# Patient Record
Sex: Male | Born: 1950 | Race: White | Hispanic: No | Marital: Married | Smoking: Former smoker
Health system: Southern US, Community
[De-identification: ages and names within clinical notes are randomized; demographics above are authoritative.]

## PROBLEM LIST (undated history)

## (undated) DIAGNOSIS — I6529 Occlusion and stenosis of unspecified carotid artery: Secondary | ICD-10-CM

## (undated) DIAGNOSIS — I059 Rheumatic mitral valve disease, unspecified: Secondary | ICD-10-CM

## (undated) DIAGNOSIS — I519 Heart disease, unspecified: Secondary | ICD-10-CM

## (undated) DIAGNOSIS — I359 Nonrheumatic aortic valve disorder, unspecified: Secondary | ICD-10-CM

## (undated) DIAGNOSIS — I079 Rheumatic tricuspid valve disease, unspecified: Secondary | ICD-10-CM

## (undated) DIAGNOSIS — Z951 Presence of aortocoronary bypass graft: Secondary | ICD-10-CM

## (undated) DIAGNOSIS — E785 Hyperlipidemia, unspecified: Secondary | ICD-10-CM

## (undated) DIAGNOSIS — I77810 Thoracic aortic ectasia: Secondary | ICD-10-CM

## (undated) DIAGNOSIS — I10 Essential (primary) hypertension: Secondary | ICD-10-CM

## (undated) HISTORY — DX: Hyperlipidemia, unspecified: E78.5

## (undated) HISTORY — DX: Occlusion and stenosis of unspecified carotid artery: I65.29

## (undated) HISTORY — DX: Nonrheumatic aortic valve disorder, unspecified: I35.9

## (undated) HISTORY — PX: CARDIAC CATHETERIZATION: SHX172

## (undated) HISTORY — DX: Rheumatic mitral valve disease, unspecified: I05.9

## (undated) HISTORY — DX: Rheumatic tricuspid valve disease, unspecified: I07.9

## (undated) HISTORY — DX: Heart disease, unspecified: I51.9

## (undated) HISTORY — DX: Thoracic aortic ectasia: I77.810

---

## 2005-10-01 ENCOUNTER — Ambulatory Visit: Payer: Self-pay | Admitting: Sports Medicine

## 2007-04-23 HISTORY — PX: COLONOSCOPY: SHX174

## 2013-09-16 ENCOUNTER — Encounter (HOSPITAL_COMMUNITY): Payer: Self-pay | Admitting: Emergency Medicine

## 2013-09-16 ENCOUNTER — Emergency Department (HOSPITAL_COMMUNITY): Payer: BC Managed Care – PPO

## 2013-09-16 ENCOUNTER — Emergency Department (HOSPITAL_COMMUNITY)
Admission: EM | Admit: 2013-09-16 | Discharge: 2013-09-16 | Disposition: A | Discharge status: Home / Self Care | Payer: BC Managed Care – PPO | Attending: Emergency Medicine | Admitting: Emergency Medicine

## 2013-09-16 DIAGNOSIS — R55 Syncope and collapse: Secondary | ICD-10-CM | POA: Insufficient documentation

## 2013-09-16 DIAGNOSIS — Z87891 Personal history of nicotine dependence: Secondary | ICD-10-CM | POA: Insufficient documentation

## 2013-09-16 DIAGNOSIS — I10 Essential (primary) hypertension: Secondary | ICD-10-CM | POA: Insufficient documentation

## 2013-09-16 DIAGNOSIS — E860 Dehydration: Secondary | ICD-10-CM

## 2013-09-16 DIAGNOSIS — Z79899 Other long term (current) drug therapy: Secondary | ICD-10-CM | POA: Insufficient documentation

## 2013-09-16 DIAGNOSIS — R42 Dizziness and giddiness: Secondary | ICD-10-CM | POA: Insufficient documentation

## 2013-09-16 DIAGNOSIS — Z791 Long term (current) use of non-steroidal anti-inflammatories (NSAID): Secondary | ICD-10-CM | POA: Insufficient documentation

## 2013-09-16 DIAGNOSIS — R61 Generalized hyperhidrosis: Secondary | ICD-10-CM | POA: Insufficient documentation

## 2013-09-16 HISTORY — DX: Essential (primary) hypertension: I10

## 2013-09-16 LAB — URINALYSIS, ROUTINE W REFLEX MICROSCOPIC
Bilirubin Urine: NEGATIVE
Glucose, UA: NEGATIVE mg/dL
Hgb urine dipstick: NEGATIVE
Ketones, ur: NEGATIVE mg/dL
LEUKOCYTES UA: NEGATIVE
Nitrite: NEGATIVE
PROTEIN: NEGATIVE mg/dL
Specific Gravity, Urine: 1.027 (ref 1.005–1.030)
UROBILINOGEN UA: 0.2 mg/dL (ref 0.0–1.0)
pH: 5.5 (ref 5.0–8.0)

## 2013-09-16 LAB — CBC WITH DIFFERENTIAL/PLATELET
BASOS PCT: 0 % (ref 0–1)
Basophils Absolute: 0 10*3/uL (ref 0.0–0.1)
Eosinophils Absolute: 0.2 10*3/uL (ref 0.0–0.7)
Eosinophils Relative: 3 % (ref 0–5)
HCT: 38.3 % — ABNORMAL LOW (ref 39.0–52.0)
HEMOGLOBIN: 12.9 g/dL — AB (ref 13.0–17.0)
Lymphocytes Relative: 23 % (ref 12–46)
Lymphs Abs: 1.4 10*3/uL (ref 0.7–4.0)
MCH: 30.5 pg (ref 26.0–34.0)
MCHC: 33.7 g/dL (ref 30.0–36.0)
MCV: 90.5 fL (ref 78.0–100.0)
MONOS PCT: 7 % (ref 3–12)
Monocytes Absolute: 0.4 10*3/uL (ref 0.1–1.0)
NEUTROS PCT: 67 % (ref 43–77)
Neutro Abs: 4.1 10*3/uL (ref 1.7–7.7)
Platelets: 178 10*3/uL (ref 150–400)
RBC: 4.23 MIL/uL (ref 4.22–5.81)
RDW: 13.7 % (ref 11.5–15.5)
WBC: 6.2 10*3/uL (ref 4.0–10.5)

## 2013-09-16 LAB — COMPREHENSIVE METABOLIC PANEL
ALBUMIN: 3.7 g/dL (ref 3.5–5.2)
ALT: 35 U/L (ref 0–53)
AST: 39 U/L — ABNORMAL HIGH (ref 0–37)
Alkaline Phosphatase: 55 U/L (ref 39–117)
BUN: 24 mg/dL — AB (ref 6–23)
CO2: 25 mEq/L (ref 19–32)
CREATININE: 0.75 mg/dL (ref 0.50–1.35)
Calcium: 9.2 mg/dL (ref 8.4–10.5)
Chloride: 103 mEq/L (ref 96–112)
GFR calc Af Amer: >90 mL/min (ref >90)
GFR calc non Af Amer: >90 mL/min (ref >90)
Glucose, Bld: 92 mg/dL (ref 70–99)
POTASSIUM: 4 meq/L (ref 3.7–5.3)
Sodium: 140 mEq/L (ref 137–147)
Total Bilirubin: 0.4 mg/dL (ref 0.3–1.2)
Total Protein: 6.4 g/dL (ref 6.0–8.3)

## 2013-09-16 LAB — I-STAT TROPONIN, ED: TROPONIN I, POC: 0 ng/mL (ref 0.00–0.08)

## 2013-09-16 LAB — ETHANOL: Alcohol, Ethyl (B): <11 mg/dL (ref 0–11)

## 2013-09-16 MED ORDER — SODIUM CHLORIDE 0.9 % IV BOLUS (SEPSIS)
1000.0000 mL | Freq: Once | INTRAVENOUS | Status: AC
  Administered 2013-09-16: 1000 mL via INTRAVENOUS

## 2013-09-16 NOTE — Discharge Instructions (Signed)
Dehydration, Adult Dehydration means your body does not have as much fluid as it needs. Your kidneys, brain, and heart will not work properly without the right amount of fluids and salt.  HOME CARE  Ask your doctor how to replace body fluid losses (rehydrate).  Drink enough fluids to keep your pee (urine) clear or pale yellow.  Drink small amounts of fluids often if you feel sick to your stomach (nauseous) or throw up (vomit).  Eat like you normally do.  Avoid:  Foods or drinks high in sugar.  Bubbly (carbonated) drinks.  Juice.  Very hot or cold fluids.  Drinks with caffeine.  Fatty, greasy foods.  Alcohol.  Tobacco.  Eating too much.  Gelatin desserts.  Wash your hands to avoid spreading germs (bacteria, viruses).  Only take medicine as told by your doctor.  Keep all doctor visits as told. GET HELP RIGHT AWAY IF:   You cannot drink something without throwing up.  You get worse even with treatment.  Your vomit has blood in it or looks greenish.  Your poop (stool) has blood in it or looks black and tarry.  You have not peed in 6 to 8 hours.  You pee a small amount of very dark pee.  You have a fever.  You pass out (faint).  You have belly (abdominal) pain that gets worse or stays in one spot (localizes).  You have a rash, stiff neck, or bad headache.  You get easily annoyed, sleepy, or are hard to wake up.  You feel weak, dizzy, or very thirsty. MAKE SURE YOU:   Understand these instructions.  Will watch your condition.  Will get help right away if you are not doing well or get worse. Document Released: 02/02/2009 Document Revised: 07/01/2011 Document Reviewed: 11/26/2010 [facility] Patient Information 2014 [place], [place]. Syncope Syncope is a fainting spell. This means the person loses consciousness and drops to the ground. The person is generally unconscious for less than 5 minutes. The person may have some muscle twitches for up to 15  seconds before waking up and returning to normal. Syncope occurs more often in elderly people, but it can happen to anyone. While most causes of syncope are not dangerous, syncope can be a sign of a serious medical problem. It is important to seek medical care.  CAUSES  Syncope is caused by a sudden decrease in blood flow to the brain. The specific cause is often not determined. Factors that can trigger syncope include:  Taking medicines that lower blood pressure.  Sudden changes in posture, such as standing up suddenly.  Taking more medicine than prescribed.  Standing in one place for too long.  Seizure disorders.  Dehydration and excessive exposure to heat.  Low blood sugar (hypoglycemia).  Straining to have a bowel movement.  Heart disease, irregular heartbeat, or other circulatory problems.  Fear, emotional distress, seeing blood, or severe pain. SYMPTOMS  Right before fainting, you may:  Feel dizzy or lightheaded.  Feel nauseous.  See all white or all black in your field of vision.  Have cold, clammy skin. DIAGNOSIS  Your caregiver will ask about your symptoms, perform a physical exam, and perform electrocardiography (ECG) to record the electrical activity of your heart. Your caregiver may also perform other heart or blood tests to determine the cause of your syncope. TREATMENT  In most cases, no treatment is needed. Depending on the cause of your syncope, your caregiver may recommend changing or stopping some of your medicines. HOME CARE  INSTRUCTIONS  Have someone stay with you until you feel stable.  Do not drive, operate machinery, or play sports until your caregiver says it is okay.  Keep all follow-up appointments as directed by your caregiver.  Lie down right away if you start feeling like you might faint. Breathe deeply and steadily. Wait until all the symptoms have passed.  Drink enough fluids to keep your urine clear or pale yellow.  If you are taking  blood pressure or heart medicine, get up slowly, taking several minutes to sit and then stand. This can reduce dizziness. SEEK IMMEDIATE MEDICAL CARE IF:   You have a severe headache.  You have unusual pain in the chest, abdomen, or back.  You are bleeding from the mouth or rectum, or you have black or tarry stool.  You have an irregular or very fast heartbeat.  You have pain with breathing.  You have repeated fainting or seizure-like jerking during an episode.  You faint when sitting or lying down.  You have confusion.  You have difficulty walking.  You have severe weakness.  You have vision problems. If you fainted, call your local emergency services (911 in [place] Do not drive yourself to the hospital.  MAKE SURE YOU:  Understand these instructions.  Will watch your condition.  Will get help right away if you are not doing well or get worse. Document Released: 04/08/2005 Document Revised: 10/08/2011 Document Reviewed: 06/07/2011 [facility] Patient Information 2014 [place].

## 2013-09-16 NOTE — ED Notes (Signed)
Per EMS, pt passed out at home. Has been working outside for several days and has had four 12oz beers today, per pt. EMS reports pt was refusing transport, they stood him up and he passed out again, vitals were Pulse 52, BP 90/48, CBG 89.

## 2013-09-16 NOTE — ED Provider Notes (Signed)
CSN: [id number]     Arrival date & time 09/16/13  1729 History   First MD Initiated Contact with Patient 09/16/13 1730     Chief Complaint  Patient presents with  . Loss of Consciousness   HPI Comments: Patient is a 63 y.o. Male who presents to the [facility] ED via EMS with 2 episodes of syncope.  First episode of syncope was witnessed by his wife who stated that the patient was unconscious between 3-5 minutes.  His wife states that during the first unconscious spell the patient became sweaty and non-responsive and then began to snore slumped over.  When EMS reported the patient stood up and had a vagal episode with HR decline to the high 40's and a decrease in systolic blood pressure to the 80's.  Vitals improved upon laying down.  Patient states that he has been working out in the sun for the past three days and has not been drinking water.  Instead he has been having 4-6 light beers per day.  Patient states that he has a history of hypertension and depression which are both stable on treatment.  He denies any fevers, headaches, nausea, vomiting, visual changes, weakness, tremors, palpitations, chest pain, shortness of breath, or changes in bowel or bladder habits.      The history is provided by the patient and the EMS personnel. No language interpreter was used.    Past Medical History  Diagnosis Date  . Hypertension    History reviewed. No pertinent past surgical history. History reviewed. No pertinent family history. History  Substance Use Topics  . Smoking status: Former Research scientist (life sciences)  . Smokeless tobacco: Not on file  . Alcohol Use: Yes    Review of Systems  Constitutional: Positive for diaphoresis. Negative for fever, chills, activity change and fatigue.  Respiratory: Negative for cough, chest tightness and shortness of breath.   Gastrointestinal: Negative for nausea, vomiting, abdominal pain, diarrhea and constipation.  Genitourinary: Negative for dysuria, frequency, hematuria and difficulty  urinating.  Neurological: Positive for syncope and light-headedness. Negative for dizziness, tremors, speech difficulty, weakness, numbness and headaches.  All other systems reviewed and are negative.     Allergies  Review of patient's allergies indicates no known allergies.  Home Medications   Prior to Admission medications   Medication Sig Start Date End Date Taking? Authorizing Provider  atorvastatin (LIPITOR) 10 MG tablet Take 10 mg by mouth daily. 08/25/13  Yes Historical Provider, MD  buPROPion (WELLBUTRIN SR) 100 MG 12 hr tablet Take 100 mg by mouth daily. 08/25/13  Yes Historical Provider, MD  lisinopril (PRINIVIL,ZESTRIL) 20 MG tablet Take 20 mg by mouth daily. 08/25/13  Yes Historical Provider, MD  naproxen sodium (ANAPROX) 220 MG tablet Take 220 mg by mouth daily as needed (pain).   Yes Historical Provider, MD  VIAGRA 100 MG tablet Take 100 mg by mouth daily as needed. Erectile Dysfunction 06/10/13  Yes Historical Provider, MD  vitamin C (ASCORBIC ACID) 500 MG tablet Take 500 mg by mouth daily.   Yes Historical Provider, MD   BP 137/79  Pulse 61  Temp(Src) 98.1 F (36.7 C) (Oral)  Resp 15  SpO2 98% Physical Exam  Nursing note and vitals reviewed. Constitutional: He is oriented to person, place, and time. He appears well-developed and well-nourished. No distress.  HENT:  Head: Normocephalic and atraumatic.  Nose: Nose normal.  Mouth/Throat: Oropharynx is clear and moist. No oropharyngeal exudate.  Eyes: Conjunctivae and EOM are normal. Pupils are equal, round, and reactive  to light. Right eye exhibits no discharge. Left eye exhibits no discharge. No scleral icterus.  Neck: Normal range of motion. Neck supple.  Cardiovascular: Normal rate, regular rhythm, normal heart sounds and intact distal pulses.  Exam reveals no gallop and no friction rub.   No murmur heard. Pulmonary/Chest: Effort normal and breath sounds normal. No respiratory distress. He has no wheezes. He has no  rales. He exhibits no tenderness.  Abdominal: Soft. Bowel sounds are normal. He exhibits no distension and no mass. There is no tenderness. There is no rebound and no guarding.  Musculoskeletal: Normal range of motion.  Lymphadenopathy:    He has no cervical adenopathy.  Neurological: He is alert and oriented to person, place, and time. No cranial nerve deficit. He exhibits normal muscle tone. Coordination normal.  Skin: Skin is warm and dry. He is not diaphoretic.  Psychiatric: He has a normal mood and affect. His behavior is normal. Judgment and thought content normal.    ED Course  Procedures (including critical care time) Labs Review Labs Reviewed  CBC WITH DIFFERENTIAL - Abnormal; Notable for the following:    Hemoglobin 12.9 (*)    HCT 38.3 (*)    All other components within normal limits  COMPREHENSIVE METABOLIC PANEL - Abnormal; Notable for the following:    BUN 24 (*)    AST 39 (*)    All other components within normal limits  ETHANOL  URINALYSIS, ROUTINE W REFLEX MICROSCOPIC  I-STAT TROPOININ, ED    Imaging Review Dg Chest 2 View  09/16/2013   CLINICAL DATA:  Syncope  EXAM: CHEST  2 VIEW  COMPARISON:  None.  FINDINGS: Cardiomediastinal silhouette is unremarkable. No acute infiltrate or pleural effusion. No pulmonary edema. Mild degenerative changes thoracic spine.  IMPRESSION: No active cardiopulmonary disease.   Electronically Signed   By: Lahoma Crocker M.D.   On: 09/16/2013 20:19     EKG Interpretation   Date/Time:  Thursday Sep 16 2013 17:51:47 EDT Ventricular Rate:  65 PR Interval:  188 QRS Duration: 91 QT Interval:  411 QTC Calculation: 427 R Axis:   55 Text Interpretation:  Sinus rhythm No old tracing to compare Confirmed by  BELFI  MD, MELANIE ([id number]) on 09/16/2013 8:13:04 PM      MDM   Final diagnoses:  Dehydration  Syncope   Patient presents to the [facility] ED with witnessed syncopal episode.  Have performed cbc, cmp, alcohol, cxr, ekg, troponin, and UA.   We have given a 1,000 mL NS bolus.  Cbc, CMP, alcohol, troponin, Ekg, alcohol, and CXR have come back unremarkable for abnormalities.  BUN to creatinine ratio shows ratio of 32:1.  Patient also has an increased urine specific gravity of 1.027 which supports dehydration.  Suspect that exertional dehydration likely cause of syncope.  Patient has been able to ambulate here without orthostatic symptoms. Will discharge the patient home at this time and have recommended close follow-up with his primary care provider.       Kenard Gower, PA-C 09/16/13 2024

## 2013-09-16 NOTE — ED Notes (Signed)
Pt returned from CT °

## 2013-09-16 NOTE — ED Notes (Signed)
Pt ambulated approximately 100 ft without difficulty and without assistance. Pt denied dizziness or weakness when ambulating.

## 2013-09-16 NOTE — ED Notes (Signed)
Patient transported to X-ray 

## 2013-09-21 NOTE — ED Provider Notes (Signed)
Medical screening examination/treatment/procedure(s) were performed by non-physician practitioner and as supervising physician I was immediately available for consultation/collaboration.   EKG Interpretation   Date/Time:  Thursday Sep 16 2013 17:51:47 EDT Ventricular Rate:  65 PR Interval:  188 QRS Duration: 91 QT Interval:  411 QTC Calculation: 427 R Axis:   55 Text Interpretation:  Sinus rhythm No old tracing to compare Confirmed by  Cheryel Kyte  MD, Ashmi Blas ([id number]) on 09/16/2013 8:13:04 PM        Malvin Johns, MD 09/21/13 2337

## 2014-03-03 ENCOUNTER — Inpatient Hospital Stay (HOSPITAL_COMMUNITY)
Admission: EM | Admit: 2014-03-03 | Discharge: 2014-03-08 | DRG: 234 | Disposition: A | Discharge status: Home / Self Care | Payer: BC Managed Care – PPO | Attending: Thoracic Surgery (Cardiothoracic Vascular Surgery) | Admitting: Thoracic Surgery (Cardiothoracic Vascular Surgery)

## 2014-03-03 ENCOUNTER — Encounter: Payer: Self-pay | Admitting: Cardiology

## 2014-03-03 ENCOUNTER — Ambulatory Visit (INDEPENDENT_AMBULATORY_CARE_PROVIDER_SITE_OTHER): Payer: BC Managed Care – PPO | Admitting: Cardiology

## 2014-03-03 ENCOUNTER — Other Ambulatory Visit: Payer: Self-pay | Admitting: *Deleted

## 2014-03-03 ENCOUNTER — Encounter (HOSPITAL_COMMUNITY)
Admission: EM | Disposition: A | Discharge status: Home / Self Care | Payer: Self-pay | Source: Home / Self Care | Attending: Thoracic Surgery (Cardiothoracic Vascular Surgery)

## 2014-03-03 ENCOUNTER — Encounter (HOSPITAL_COMMUNITY): Payer: Self-pay | Admitting: Emergency Medicine

## 2014-03-03 VITALS — BP 124/90 | HR 65 | Ht 68.0 in | Wt 147.4 lb

## 2014-03-03 DIAGNOSIS — J9811 Atelectasis: Secondary | ICD-10-CM | POA: Diagnosis not present

## 2014-03-03 DIAGNOSIS — Z8249 Family history of ischemic heart disease and other diseases of the circulatory system: Secondary | ICD-10-CM

## 2014-03-03 DIAGNOSIS — I200 Unstable angina: Secondary | ICD-10-CM | POA: Diagnosis not present

## 2014-03-03 DIAGNOSIS — Z01810 Encounter for preprocedural cardiovascular examination: Secondary | ICD-10-CM

## 2014-03-03 DIAGNOSIS — R229 Localized swelling, mass and lump, unspecified: Secondary | ICD-10-CM

## 2014-03-03 DIAGNOSIS — R079 Chest pain, unspecified: Secondary | ICD-10-CM

## 2014-03-03 DIAGNOSIS — I25110 Atherosclerotic heart disease of native coronary artery with unstable angina pectoris: Secondary | ICD-10-CM | POA: Diagnosis not present

## 2014-03-03 DIAGNOSIS — I25119 Atherosclerotic heart disease of native coronary artery with unspecified angina pectoris: Secondary | ICD-10-CM

## 2014-03-03 DIAGNOSIS — Z87891 Personal history of nicotine dependence: Secondary | ICD-10-CM | POA: Diagnosis not present

## 2014-03-03 DIAGNOSIS — I10 Essential (primary) hypertension: Secondary | ICD-10-CM | POA: Diagnosis present

## 2014-03-03 DIAGNOSIS — D696 Thrombocytopenia, unspecified: Secondary | ICD-10-CM | POA: Diagnosis not present

## 2014-03-03 DIAGNOSIS — E8770 Fluid overload, unspecified: Secondary | ICD-10-CM | POA: Diagnosis not present

## 2014-03-03 DIAGNOSIS — D62 Acute posthemorrhagic anemia: Secondary | ICD-10-CM | POA: Diagnosis not present

## 2014-03-03 DIAGNOSIS — IMO0002 Reserved for concepts with insufficient information to code with codable children: Secondary | ICD-10-CM

## 2014-03-03 DIAGNOSIS — E785 Hyperlipidemia, unspecified: Secondary | ICD-10-CM

## 2014-03-03 DIAGNOSIS — J90 Pleural effusion, not elsewhere classified: Secondary | ICD-10-CM | POA: Diagnosis not present

## 2014-03-03 DIAGNOSIS — Z7982 Long term (current) use of aspirin: Secondary | ICD-10-CM

## 2014-03-03 DIAGNOSIS — Z951 Presence of aortocoronary bypass graft: Secondary | ICD-10-CM

## 2014-03-03 DIAGNOSIS — I77810 Thoracic aortic ectasia: Secondary | ICD-10-CM | POA: Diagnosis present

## 2014-03-03 HISTORY — PX: LEFT HEART CATHETERIZATION WITH CORONARY ANGIOGRAM: SHX5451

## 2014-03-03 HISTORY — DX: Presence of aortocoronary bypass graft: Z95.1

## 2014-03-03 LAB — TYPE AND SCREEN
ABO/RH(D): A POS
ANTIBODY SCREEN: NEGATIVE

## 2014-03-03 LAB — CBC WITH DIFFERENTIAL/PLATELET
BASOS ABS: 0 10*3/uL (ref 0.0–0.1)
Basophils Relative: 0 % (ref 0–1)
EOS PCT: 3 % (ref 0–5)
Eosinophils Absolute: 0.2 10*3/uL (ref 0.0–0.7)
HEMATOCRIT: 45.3 % (ref 39.0–52.0)
HEMOGLOBIN: 15.1 g/dL (ref 13.0–17.0)
LYMPHS PCT: 28 % (ref 12–46)
Lymphs Abs: 1.7 10*3/uL (ref 0.7–4.0)
MCH: 30.6 pg (ref 26.0–34.0)
MCHC: 33.3 g/dL (ref 30.0–36.0)
MCV: 91.9 fL (ref 78.0–100.0)
MONO ABS: 0.3 10*3/uL (ref 0.1–1.0)
Monocytes Relative: 6 % (ref 3–12)
NEUTROS ABS: 3.7 10*3/uL (ref 1.7–7.7)
Neutrophils Relative %: 63 % (ref 43–77)
Platelets: 242 10*3/uL (ref 150–400)
RBC: 4.93 MIL/uL (ref 4.22–5.81)
RDW: 13 % (ref 11.5–15.5)
WBC: 5.8 10*3/uL (ref 4.0–10.5)

## 2014-03-03 LAB — ABO/RH: ABO/RH(D): A POS

## 2014-03-03 LAB — URINALYSIS, ROUTINE W REFLEX MICROSCOPIC
BILIRUBIN URINE: NEGATIVE
Glucose, UA: NEGATIVE mg/dL
HGB URINE DIPSTICK: NEGATIVE
Ketones, ur: NEGATIVE mg/dL
Leukocytes, UA: NEGATIVE
Nitrite: NEGATIVE
PH: 7.5 (ref 5.0–8.0)
Protein, ur: NEGATIVE mg/dL
SPECIFIC GRAVITY, URINE: 1.021 (ref 1.005–1.030)
UROBILINOGEN UA: 0.2 mg/dL (ref 0.0–1.0)

## 2014-03-03 LAB — HEMOGLOBIN A1C
HEMOGLOBIN A1C: 5.5 % (ref <5.7)
Mean Plasma Glucose: 111 mg/dL (ref <117)

## 2014-03-03 LAB — MRSA PCR SCREENING: MRSA by PCR: NEGATIVE

## 2014-03-03 LAB — MAGNESIUM: Magnesium: 2.2 mg/dL (ref 1.5–2.5)

## 2014-03-03 LAB — APTT: aPTT: 28 seconds (ref 24–37)

## 2014-03-03 LAB — COMPREHENSIVE METABOLIC PANEL
ALT: 34 U/L (ref 0–53)
AST: 32 U/L (ref 0–37)
Albumin: 4.1 g/dL (ref 3.5–5.2)
Alkaline Phosphatase: 67 U/L (ref 39–117)
Anion gap: 13 (ref 5–15)
BUN: 13 mg/dL (ref 6–23)
CALCIUM: 9.7 mg/dL (ref 8.4–10.5)
CHLORIDE: 99 meq/L (ref 96–112)
CO2: 25 meq/L (ref 19–32)
CREATININE: 0.67 mg/dL (ref 0.50–1.35)
GFR calc Af Amer: >90 mL/min (ref >90)
Glucose, Bld: 85 mg/dL (ref 70–99)
Potassium: 4.1 mEq/L (ref 3.7–5.3)
Sodium: 137 mEq/L (ref 137–147)
Total Bilirubin: 0.4 mg/dL (ref 0.3–1.2)
Total Protein: 7.4 g/dL (ref 6.0–8.3)

## 2014-03-03 LAB — TSH: TSH: 1.86 u[IU]/mL (ref 0.350–4.500)

## 2014-03-03 LAB — SURGICAL PCR SCREEN
MRSA, PCR: NEGATIVE
Staphylococcus aureus: NEGATIVE

## 2014-03-03 LAB — TROPONIN I
Troponin I: <0.3 ng/mL (ref <0.30)
Troponin I: <0.3 ng/mL (ref <0.30)

## 2014-03-03 LAB — PROTIME-INR
INR: 0.95 (ref 0.00–1.49)
Prothrombin Time: 12.8 seconds (ref 11.6–15.2)

## 2014-03-03 SURGERY — LEFT HEART CATHETERIZATION WITH CORONARY ANGIOGRAM
Anesthesia: LOCAL

## 2014-03-03 MED ORDER — METOPROLOL TARTRATE 12.5 MG HALF TABLET
12.5000 mg | ORAL_TABLET | Freq: Once | ORAL | Status: AC
  Administered 2014-03-04: 12.5 mg via ORAL
  Filled 2014-03-03: qty 1

## 2014-03-03 MED ORDER — METOPROLOL TARTRATE 12.5 MG HALF TABLET
12.5000 mg | ORAL_TABLET | Freq: Two times a day (BID) | ORAL | Status: DC
  Administered 2014-03-03: 12.5 mg via ORAL
  Filled 2014-03-03 (×2): qty 1

## 2014-03-03 MED ORDER — MAGNESIUM SULFATE 50 % IJ SOLN
40.0000 meq | INTRAMUSCULAR | Status: DC
  Filled 2014-03-03: qty 10

## 2014-03-03 MED ORDER — SODIUM CHLORIDE 0.9 % IV SOLN
INTRAVENOUS | Status: AC
  Administered 2014-03-04: 1.2 [IU]/h via INTRAVENOUS
  Filled 2014-03-03: qty 2.5

## 2014-03-03 MED ORDER — SODIUM CHLORIDE 0.9 % IV SOLN: 250.0000 mL | INTRAVENOUS | Status: DC | PRN

## 2014-03-03 MED ORDER — DEXTROSE 5 % IV SOLN
750.0000 mg | INTRAVENOUS | Status: DC
  Filled 2014-03-03: qty 750

## 2014-03-03 MED ORDER — SODIUM CHLORIDE 0.9 % IJ SOLN
3.0000 mL | Freq: Two times a day (BID) | INTRAMUSCULAR | Status: DC
  Administered 2014-03-04: 3 mL via INTRAVENOUS

## 2014-03-03 MED ORDER — FENTANYL CITRATE 0.05 MG/ML IJ SOLN
INTRAMUSCULAR | Status: AC
  Filled 2014-03-03: qty 2

## 2014-03-03 MED ORDER — ATORVASTATIN CALCIUM 40 MG PO TABS
40.0000 mg | ORAL_TABLET | Freq: Every day | ORAL | Status: DC
  Filled 2014-03-03 (×2): qty 1

## 2014-03-03 MED ORDER — SODIUM CHLORIDE 0.9 % IV SOLN
1.0000 mL/kg/h | INTRAVENOUS | Status: DC
  Administered 2014-03-03: 1 mL/kg/h via INTRAVENOUS

## 2014-03-03 MED ORDER — VERAPAMIL HCL 2.5 MG/ML IV SOLN
INTRAVENOUS | Status: AC
  Filled 2014-03-03: qty 2

## 2014-03-03 MED ORDER — ASPIRIN 81 MG PO CHEW
324.0000 mg | CHEWABLE_TABLET | Freq: Once | ORAL | Status: AC
  Administered 2014-03-03: 324 mg via ORAL

## 2014-03-03 MED ORDER — HEPARIN (PORCINE) IN NACL 100-0.45 UNIT/ML-% IJ SOLN
800.0000 [IU]/h | INTRAMUSCULAR | Status: DC
  Administered 2014-03-03: 800 [IU]/h via INTRAVENOUS
  Filled 2014-03-03: qty 250

## 2014-03-03 MED ORDER — MIDAZOLAM HCL 2 MG/2ML IJ SOLN
INTRAMUSCULAR | Status: AC
  Filled 2014-03-03: qty 2

## 2014-03-03 MED ORDER — NITROGLYCERIN 0.4 MG SL SUBL
0.4000 mg | SUBLINGUAL_TABLET | SUBLINGUAL | Status: DC | PRN
  Administered 2014-03-03: 0.4 mg via SUBLINGUAL

## 2014-03-03 MED ORDER — SODIUM CHLORIDE 0.9 % IJ SOLN: 3.0000 mL | INTRAMUSCULAR | Status: DC | PRN

## 2014-03-03 MED ORDER — SODIUM CHLORIDE 0.9 % IV SOLN
250.0000 mL | INTRAVENOUS | Status: DC | PRN
  Administered 2014-03-03: 500 mL via INTRAVENOUS

## 2014-03-03 MED ORDER — PHENYLEPHRINE HCL 10 MG/ML IJ SOLN
30.0000 ug/min | INTRAVENOUS | Status: AC
  Administered 2014-03-04: 10 ug/min via INTRAVENOUS
  Filled 2014-03-03: qty 2

## 2014-03-03 MED ORDER — PLASMA-LYTE 148 IV SOLN
INTRAVENOUS | Status: AC
  Administered 2014-03-04: 500 mL
  Filled 2014-03-03: qty 2.5

## 2014-03-03 MED ORDER — DEXMEDETOMIDINE HCL IN NACL 400 MCG/100ML IV SOLN
0.1000 ug/kg/h | INTRAVENOUS | Status: AC
  Administered 2014-03-04: 0.3 ug/kg/h via INTRAVENOUS
  Filled 2014-03-03: qty 100

## 2014-03-03 MED ORDER — POTASSIUM CHLORIDE 2 MEQ/ML IV SOLN
80.0000 meq | INTRAVENOUS | Status: DC
  Filled 2014-03-03: qty 40

## 2014-03-03 MED ORDER — ASPIRIN 81 MG PO CHEW: 81.0000 mg | CHEWABLE_TABLET | ORAL | Status: DC

## 2014-03-03 MED ORDER — SODIUM CHLORIDE 0.9 % IV SOLN: INTRAVENOUS | Status: DC

## 2014-03-03 MED ORDER — NITROGLYCERIN 1 MG/10 ML FOR IR/CATH LAB
INTRA_ARTERIAL | Status: AC
  Filled 2014-03-03: qty 10

## 2014-03-03 MED ORDER — HEPARIN BOLUS VIA INFUSION
4000.0000 [IU] | Freq: Once | INTRAVENOUS | Status: AC
  Administered 2014-03-03: 4000 [IU] via INTRAVENOUS
  Filled 2014-03-03: qty 4000

## 2014-03-03 MED ORDER — SODIUM CHLORIDE 0.9 % IV SOLN
INTRAVENOUS | Status: AC
  Administered 2014-03-04: 69.8 mL/h via INTRAVENOUS
  Filled 2014-03-03: qty 40

## 2014-03-03 MED ORDER — DEXTROSE 5 % IV SOLN
1.5000 g | INTRAVENOUS | Status: AC
  Administered 2014-03-04: 1.5 g via INTRAVENOUS
  Administered 2014-03-04: .75 g via INTRAVENOUS
  Filled 2014-03-03: qty 1.5

## 2014-03-03 MED ORDER — LIDOCAINE HCL (PF) 1 % IJ SOLN
INTRAMUSCULAR | Status: AC
  Filled 2014-03-03: qty 30

## 2014-03-03 MED ORDER — NITROGLYCERIN IN D5W 200-5 MCG/ML-% IV SOLN
3.0000 ug/min | INTRAVENOUS | Status: DC
  Administered 2014-03-03: 5 ug/min via INTRAVENOUS
  Filled 2014-03-03: qty 250

## 2014-03-03 MED ORDER — NITROGLYCERIN 0.4 MG SL SUBL: 0.4000 mg | SUBLINGUAL_TABLET | SUBLINGUAL | Status: DC | PRN

## 2014-03-03 MED ORDER — VANCOMYCIN HCL 10 G IV SOLR
1250.0000 mg | INTRAVENOUS | Status: AC
  Administered 2014-03-04: 1250 mg via INTRAVENOUS
  Filled 2014-03-03: qty 1250

## 2014-03-03 MED ORDER — DOPAMINE-DEXTROSE 3.2-5 MG/ML-% IV SOLN
0.0000 ug/kg/min | INTRAVENOUS | Status: DC
  Filled 2014-03-03: qty 250

## 2014-03-03 MED ORDER — HEPARIN (PORCINE) IN NACL 2-0.9 UNIT/ML-% IJ SOLN
INTRAMUSCULAR | Status: AC
Start: 2014-03-03 — End: 2014-03-03
  Filled 2014-03-03: qty 1000

## 2014-03-03 MED ORDER — SODIUM CHLORIDE 0.9 % IV SOLN
INTRAVENOUS | Status: DC
  Filled 2014-03-03: qty 30

## 2014-03-03 MED ORDER — HEPARIN (PORCINE) IN NACL 100-0.45 UNIT/ML-% IJ SOLN
800.0000 [IU]/h | INTRAMUSCULAR | Status: DC
  Administered 2014-03-03: 800 [IU]/h via INTRAVENOUS

## 2014-03-03 MED ORDER — EPINEPHRINE HCL 1 MG/ML IJ SOLN
0.0000 ug/min | INTRAVENOUS | Status: DC
  Filled 2014-03-03: qty 4

## 2014-03-03 MED ORDER — BISACODYL 5 MG PO TBEC: 5.0000 mg | DELAYED_RELEASE_TABLET | Freq: Once | ORAL | Status: DC

## 2014-03-03 MED ORDER — ASPIRIN 300 MG RE SUPP: 300.0000 mg | RECTAL | Status: DC

## 2014-03-03 MED ORDER — ONDANSETRON HCL 4 MG/2ML IJ SOLN: 4.0000 mg | Freq: Four times a day (QID) | INTRAMUSCULAR | Status: DC | PRN

## 2014-03-03 MED ORDER — SODIUM CHLORIDE 0.9 % IJ SOLN
3.0000 mL | INTRAMUSCULAR | Status: DC | PRN
Start: 2014-03-03 — End: 2014-03-04

## 2014-03-03 MED ORDER — SODIUM CHLORIDE 0.9 % IJ SOLN
3.0000 mL | Freq: Two times a day (BID) | INTRAMUSCULAR | Status: DC
  Administered 2014-03-03: 3 mL via INTRAVENOUS

## 2014-03-03 MED ORDER — VANCOMYCIN HCL 1000 MG IV SOLR
INTRAVENOUS | Status: AC
  Administered 2014-03-04: 1000 mL
  Filled 2014-03-03: qty 1000

## 2014-03-03 MED ORDER — ASPIRIN EC 81 MG PO TBEC: 324.0000 mg | DELAYED_RELEASE_TABLET | Freq: Once | ORAL | Status: DC

## 2014-03-03 MED ORDER — ~~LOC~~ CARDIAC SURGERY, PATIENT & FAMILY EDUCATION
Freq: Once | Status: AC
  Administered 2014-03-03: 19:00:00
  Filled 2014-03-03: qty 1

## 2014-03-03 MED ORDER — ASPIRIN 81 MG PO CHEW: 324.0000 mg | CHEWABLE_TABLET | ORAL | Status: DC

## 2014-03-03 MED ORDER — TEMAZEPAM 15 MG PO CAPS: 15.0000 mg | ORAL_CAPSULE | Freq: Once | ORAL | Status: AC | PRN

## 2014-03-03 MED ORDER — METOPROLOL TARTRATE 12.5 MG HALF TABLET
12.5000 mg | ORAL_TABLET | Freq: Two times a day (BID) | ORAL | Status: DC
  Administered 2014-03-04: 12.5 mg via ORAL
  Filled 2014-03-03 (×2): qty 1

## 2014-03-03 MED ORDER — NITROGLYCERIN IN D5W 200-5 MCG/ML-% IV SOLN
2.0000 ug/min | INTRAVENOUS | Status: DC
Start: 2014-03-04 — End: 2014-03-04
  Filled 2014-03-03: qty 250

## 2014-03-03 MED ORDER — ACETAMINOPHEN 325 MG PO TABS: 650.0000 mg | ORAL_TABLET | ORAL | Status: DC | PRN

## 2014-03-03 MED ORDER — ASPIRIN EC 81 MG PO TBEC
81.0000 mg | DELAYED_RELEASE_TABLET | Freq: Every day | ORAL | Status: DC
  Administered 2014-03-04: 81 mg via ORAL
  Filled 2014-03-03: qty 1

## 2014-03-03 MED ORDER — HEPARIN SODIUM (PORCINE) 1000 UNIT/ML IJ SOLN
INTRAMUSCULAR | Status: AC
  Filled 2014-03-03: qty 1

## 2014-03-03 NOTE — Progress Notes (Signed)
ANTICOAGULATION CONSULT NOTE - Initial Consult  Pharmacy Consult for Heparin Indication: chest pain/ACS  No Known Allergies  Patient Measurements: Height: 5\' 8"  (172.7 cm) Weight: 147 lb (66.679 kg) IBW/kg (Calculated) : 68.4  Vital Signs: Temp: 98.4 F (36.9 C) (11/12 1057) Temp Source: Oral (11/12 1057) BP: 151/88 mmHg (11/12 1057) Pulse Rate: 65 (11/12 0949)  Labs: No results for input(s): HGB, HCT, PLT, APTT, LABPROT, INR, HEPARINUNFRC, CREATININE, CKTOTAL, CKMB, TROPONINI in the last 72 hours.  Estimated Creatinine Clearance: 86.8 mL/min (by C-G formula based on Cr of 0.75).   Medical History: Past Medical History  Diagnosis Date  . Hyperlipidemia   . Hypertension     Medications:  See electronic med rec   Assessment: 63 y.o. male presents with crescendo angina. EKG shows new T-wave inversions. Admitted directly from o/p office. To begin heparin gtt for r/o ACS. Plan for cath today. Baseline labs pending.  Goal of Therapy:  Heparin level 0.3-0.7 units/ml Monitor platelets by anticoagulation protocol: Yes   Plan:  1. Heparin IV bolus 4000 units 2. Heparin gtt at 800 units/hr 3. Will f/u post cath  Sherlon Handing, PharmD, BCPS Clinical pharmacist, pager [phone] 03/03/2014,10:59 AM

## 2014-03-03 NOTE — Progress Notes (Signed)
ANTICOAGULATION CONSULT NOTE - Follow Up Consult  Pharmacy Consult:  Heparin Indication:  S/p cath, awaiting CABG decision  No Known Allergies  Patient Measurements: Height: 5\' 8"  (172.7 cm) Weight: 143 lb 11.8 oz (65.2 kg) IBW/kg (Calculated) : 68.4 Heparin Dosing Weight:  68 kg  Vital Signs: Temp: 98.6 F (37 C) (11/12 1156) Temp Source: Oral (11/12 1156) BP: 169/95 mmHg (11/12 1511) Pulse Rate: 68 (11/12 1511)  Labs:  Recent Labs  03/03/14 1100  HGB 15.1  HCT 45.3  PLT 242  APTT 28  LABPROT 12.8  INR 0.95  CREATININE 0.67  TROPONINI <0.30    Estimated Creatinine Clearance: 84.9 mL/min (by C-G formula based on Cr of 0.67).     Assessment: 24 YOM started on IV heparin for rule out ACS.  He is now s/p cath and heparin to resume 2 hours post TR band removal (removing now ~1745 per RN).  Awaiting decision regarding CABG.   Goal of Therapy:  Heparin level 0.3-0.7 units/ml Monitor platelets by anticoagulation protocol: Yes    Plan:  - At 2000, resume heparin gtt at 800 units/hr - Check 6 hr HL after heparin is resumed - D/C heparin gtt at 0500 on 03/04/14 per MD order - Monitor closely for s/sx of bleeding/hematoma    Khair Chasteen D. Mina Marble, PharmD, BCPS Pager:  [phone] 03/03/2014, 5:42 PM

## 2014-03-03 NOTE — Interval H&P Note (Signed)
History and Physical Interval Note:  03/03/2014 2:22 PM  Travis Harrison  has presented today for surgery, with the diagnosis of cp  The various methods of treatment have been discussed with the patient and family. After consideration of risks, benefits and other options for treatment, the patient has consented to  Procedure(s): LEFT HEART CATHETERIZATION WITH CORONARY ANGIOGRAM (N/A) as a surgical intervention .  The patient's history has been reviewed, patient examined, no change in status, stable for surgery.  I have reviewed the patient's chart and labs.  Questions were answered to the patient's satisfaction.    Cath Lab Visit (complete for each Cath Lab visit)  Clinical Evaluation Leading to the Procedure:   ACS: Yes.    Non-ACS:    Anginal Classification: CCS III  Anti-ischemic medical therapy: No Therapy  Non-Invasive Test Results: No non-invasive testing performed  Prior CABG: No previous CABG       Sherren Mocha

## 2014-03-03 NOTE — Progress Notes (Signed)
Patient signed consents, stated that the reason, risk and benefits of the surgery were explained to him and he had a clear understanding of the procedure. Procedural Consents for CABG placed in chart. Roxan Hockey, RN

## 2014-03-03 NOTE — Progress Notes (Signed)
Utilization Review Completed.Donne Anon T11/03/2014

## 2014-03-03 NOTE — Progress Notes (Signed)
Instructed patient on use of I/S. Patient demonstrated feedback and shows no signs of distress while using I/S. No problems going to top level. Patient stated he feels good about using it and instructed pillow holding against chest. Open Heart Surgery booklet given to patient. No questions were asked and family at bedside. Will continue to monitor closely.

## 2014-03-03 NOTE — ED Notes (Signed)
Pt had an episode of chest pain last night, felt dizzy, weak, chest pressure. Pt went home and rested. Pt made an appointment with [place] cardiology- they sent him here for further eval and possible cath. Pt denies CP currently at rest, only with exertion. Pt feels somewhat SOB. EKG shows SR. BP 140/70, HR 70's. Pt was given 324 asa and 1 nitro at office visit this morning.

## 2014-03-03 NOTE — ED Notes (Signed)
Obtained consent. Pt chest pain free. Report has been called.

## 2014-03-03 NOTE — Progress Notes (Signed)
Educated patient and his wife very thoroughly  on his Heart Surgery procedure. Patient also given education packets and is to watch the video. Patient stated that he had no additional questions or concerns related to his heart surgery. Roxan Hockey, RN

## 2014-03-03 NOTE — ED Provider Notes (Signed)
CSN: [id number]     Arrival date & time 03/03/14  1040 History   First MD Initiated Contact with Patient 03/03/14 1042     Chief Complaint  Patient presents with  . Chest Pain     (Consider location/radiation/quality/duration/timing/severity/associated sxs/prior Treatment) HPI Comments: Patient presents to the emergency department with chief complaint of chest pain and shortness of breath that started last night. Patient states that he went home to rest, and he began to feel better.  Patient made an appointment with [facility] this morning and was seen by Dr. Radford Pax.  Dr. Radford Pax transferred the patient to the ED for admission of unstable angina and possible cardiac cath.  Patient was given aspirin and SL nitro and cardiology office.  Patient states that he is pain free.  He denies any SOB.  He states that his symptoms only come on with exertion.  Cardiac risk factors are hyperlipidemia, HTN, prior smoking history, and family history.  The history is provided by the patient. No language interpreter was used.    Past Medical History  Diagnosis Date  . Hyperlipidemia   . Hypertension    History reviewed. No pertinent past surgical history. Family History  Problem Relation Age of Onset  . Heart disease Mother   . Heart attack Mother   . CVA Father   . Hypertension Father   . Heart attack Father   . Heart disease Father    History  Substance Use Topics  . Smoking status: Former Smoker    Quit date: 03/04/1999  . Smokeless tobacco: Not on file  . Alcohol Use: 24.0 oz/week    40 Cans of beer per week    Review of Systems  Constitutional: Negative for fever and chills.  Respiratory: Positive for shortness of breath.   Cardiovascular: Positive for chest pain.  Gastrointestinal: Negative for nausea, vomiting, diarrhea and constipation.  Genitourinary: Negative for dysuria.  All other systems reviewed and are negative.     Allergies  Review of patient's allergies  indicates no known allergies.  Home Medications   Prior to Admission medications   Medication Sig Start Date End Date Taking? Authorizing Provider  aspirin EC 81 MG tablet Take 4 tablets (324 mg total) by mouth once. 03/03/14   Sueanne Margarita, MD  atorvastatin (LIPITOR) 10 MG tablet Take 10 mg by mouth daily. 08/25/13   Historical Provider, MD  buPROPion (WELLBUTRIN SR) 100 MG 12 hr tablet Take 100 mg by mouth daily. 08/25/13   Historical Provider, MD  lisinopril (PRINIVIL,ZESTRIL) 20 MG tablet Take 20 mg by mouth daily. 08/25/13   Historical Provider, MD  naproxen sodium (ANAPROX) 220 MG tablet Take 220 mg by mouth daily as needed (pain).    Historical Provider, MD  VIAGRA 100 MG tablet Take 100 mg by mouth daily as needed. Erectile Dysfunction 06/10/13   Historical Provider, MD  vitamin C (ASCORBIC ACID) 500 MG tablet Take 500 mg by mouth daily.    Historical Provider, MD   BP 151/88 mmHg  Temp(Src) 98.4 F (36.9 C) (Oral)  Resp 15  SpO2 97% Physical Exam  Constitutional: He is oriented to person, place, and time. He appears well-developed and well-nourished.  HENT:  Head: Normocephalic and atraumatic.  Eyes: Conjunctivae and EOM are normal. Pupils are equal, round, and reactive to light. Right eye exhibits no discharge. Left eye exhibits no discharge. No scleral icterus.  Neck: Normal range of motion. Neck supple. No JVD present.  Cardiovascular: Normal rate, regular rhythm and  normal heart sounds.  Exam reveals no gallop and no friction rub.   No murmur heard. Pulmonary/Chest: Effort normal and breath sounds normal. No respiratory distress. He has no wheezes. He has no rales. He exhibits no tenderness.  Abdominal: Soft. He exhibits no distension and no mass. There is no tenderness. There is no rebound and no guarding.  Musculoskeletal: Normal range of motion. He exhibits no edema or tenderness.  Neurological: He is alert and oriented to person, place, and time.  Skin: Skin is warm and  dry.  Psychiatric: He has a normal mood and affect. His behavior is normal. Judgment and thought content normal.  Nursing note and vitals reviewed.   ED Course  Procedures (including critical care time) Labs Review Labs Reviewed  CBC  PLATELET INHIBITION P2Y12  PROTIME-INR  CBC WITH DIFFERENTIAL  COMPREHENSIVE METABOLIC PANEL  TROPONIN I  TROPONIN I  TROPONIN I    Imaging Review No results found.   EKG Interpretation None      MDM   Final diagnoses:  Unstable angina    Patient with increasingly frequent episodes of exertional chest pain and shortness of breath.  Seen by Dr. Radford Pax in the office this morning and sent to ED for admission.  Admission orders placed by Dr. Radford Pax.  I have seen and evaluated the patient in the ED.  Labs are pending as ordered by Dr. Radford Pax.  Will start heparin in the ED per Dr. Radford Pax.  Will inform cardiology of Mr. Duval safe arrival to the ED, and inform them that he is ready for admission.  Discussed with Dr. Rogene Houston, who agrees with the plan.   Montine Circle, PA-C 03/03/14 [place], MD 03/03/14 1148

## 2014-03-03 NOTE — Consult Note (Signed)
[name]       [name] [address]             [phone]          CARDIOTHORACIC SURGERY CONSULTATION REPORT  PCP is Dorian Heckle, MD Referring Provider is Sherren Mocha, MD   Reason for consultation:  Left main disease  HPI:  Patient is a 63 year old white male with no previous history of coronary artery disease but was factors notable for history of hypertension, hyperlipidemia, remote tobacco use, and a strong family history of coronary artery disease who presents with a one-month history of classical symptoms of angina pectoris. The patient states that over the past 6 week he has had accelerating symptoms of substernal chest pressure which have developed only with exertion. The first episode was a proximally 5 or 6 weeks ago and developed while the patient was raking leaves. Symptoms subsided within 5-10 minutes of rest.  He's had several similar episodes over the past month, and last night at work the patient had 2 episodes while he was lifting some relatively heavy cases of liquor. The second episode was more severe and lasted for nearly 20 minutes before it gradually subsided.  With severe chest pressure the patient has also developed numbness radiating to the neck and left arm and mild shortness of breath. He denies any episodes of chest discomfort occurring at rest. He has not had nocturnal symptoms. In the absence of chest discomfort he has not experienced any recent symptoms of exertional shortness of breath. He denies a history of PND, orthopnea, or lower extremity edema. He presented to [facility] earlier today where he was evaluated by Dr. Radford Pax.  The patient was admitted directly to the hospital and underwent cardiac catheterization by Dr. Burt Knack. He was found to have critical 90% stenosis of the proximal left main coronary artery with normal left ventricular systolic function. Cardiothoracic surgical consultation was requested.  The patient is  married and lives locally in [place] with his wife. He works full-time for the [employer]. He has remained physically active and otherwise healthy all of his life. He walks nearly every day, typically 2 miles at a time. He reports no significant physical limitations.  Past Medical History  Diagnosis Date  . Hyperlipidemia   . Hypertension     History reviewed. No pertinent past surgical history.  Family History  Problem Relation Age of Onset  . Heart disease Mother   . Heart attack Mother   . CVA Father   . Hypertension Father   . Heart attack Father 21  . Heart disease Father     History   Social History  . Marital Status: Divorced    Spouse Name: N/A    Number of Children: N/A  . Years of Education: N/A   Occupational History  . Not on file.   Social History Main Topics  . Smoking status: Former Smoker    Quit date: 03/04/1999  . Smokeless tobacco: Not on file  . Alcohol Use: 24.0 oz/week    40 Cans of beer per week  . Drug Use: Not on file  . Sexual Activity: Not on file   Other Topics Concern  . Not on file   Social History Narrative   Married and lives with his wife.  Works for [employer].  Physically active, walks 2 miles daily.    Prior to Admission medications   Medication Sig Start Date End  Date Taking? Authorizing Provider  aspirin EC 81 MG tablet Take 4 tablets (324 mg total) by mouth once. 03/03/14  Yes Sueanne Margarita, MD  atorvastatin (LIPITOR) 10 MG tablet Take 10 mg by mouth daily. 08/25/13  Yes Historical Provider, MD  buPROPion (WELLBUTRIN SR) 100 MG 12 hr tablet Take 100 mg by mouth daily. 08/25/13  Yes Historical Provider, MD  lisinopril (PRINIVIL,ZESTRIL) 20 MG tablet Take 20 mg by mouth daily. 08/25/13  Yes Historical Provider, MD  VIAGRA 100 MG tablet Take 100 mg by mouth daily as needed. Erectile Dysfunction 06/10/13  Yes Historical Provider, MD    Current Facility-Administered Medications  Medication Dose Route  Frequency Provider Last Rate Last Dose  . 0.9 %  sodium chloride infusion  250 mL Intravenous PRN Sueanne Margarita, MD 10 mL/hr at 03/03/14 1200 500 mL at 03/03/14 1200  . acetaminophen (TYLENOL) tablet 650 mg  650 mg Oral Q4H PRN Sueanne Margarita, MD      . aspirin chewable tablet 324 mg  324 mg Oral NOW Sueanne Margarita, MD   324 mg at 03/03/14 1201   Or  . aspirin suppository 300 mg  300 mg Rectal NOW Sueanne Margarita, MD      . Derrill Memo ON 03/04/2014] aspirin chewable tablet 81 mg  81 mg Oral Pre-Cath Sueanne Margarita, MD      . Derrill Memo ON 03/04/2014] aspirin EC tablet 81 mg  81 mg Oral Daily Sueanne Margarita, MD      . atorvastatin (LIPITOR) tablet 40 mg  40 mg Oral q1800 Sueanne Margarita, MD      . heparin ADULT infusion 100 units/mL (25000 units/250 mL)  800 Units/hr Intravenous Continuous Sindy Guadeloupe, [facility]   Stopped at 03/03/14 1346  . metoprolol tartrate (LOPRESSOR) tablet 12.5 mg  12.5 mg Oral BID Sueanne Margarita, MD      . nitroGLYCERIN (NITROSTAT) SL tablet 0.4 mg  0.4 mg Sublingual Q5 Min x 3 PRN Sueanne Margarita, MD      . nitroGLYCERIN 50 mg in dextrose 5 % 250 mL (0.2 mg/mL) infusion  3-30 mcg/min Intravenous Titrated Sueanne Margarita, MD 1.5 mL/hr at 03/03/14 1234 5 mcg/min at 03/03/14 1234  . ondansetron (ZOFRAN) injection 4 mg  4 mg Intravenous Q6H PRN Sueanne Margarita, MD      . sodium chloride 0.9 % injection 3 mL  3 mL Intravenous Q12H Traci R Turner, MD      . sodium chloride 0.9 % injection 3 mL  3 mL Intravenous PRN Sueanne Margarita, MD        No Known Allergies    Review of Systems:   General:  Normal appetite, normal energy, no weight gain, no weight loss, no fever  Cardiac:  + chest pain with exertion, no chest pain at rest, no SOB with exertion, no resting SOB, no PND, no orthopnea, no palpitations, no arrhythmia, no atrial fibrillation, no LE edema, no dizzy spells, no syncope  Respiratory:  no shortness of breath, no home oxygen, no productive cough, no dry cough, no bronchitis,  no wheezing, no hemoptysis, no asthma, no pain with inspiration or cough, no sleep apnea, no CPAP at night  GI:   no difficulty swallowing, no reflux, no frequent heartburn, no hiatal hernia, no abdominal pain, no constipation, no diarrhea, no hematochezia, no hematemesis, no melena  GU:   no dysuria,  no frequency, no urinary tract infection, no hematuria, no enlarged prostate,  no kidney stones, no kidney disease  Vascular:  no pain suggestive of claudication, no pain in feet, no leg cramps, no varicose veins, no DVT, no non-healing foot ulcer  Neuro:   no stroke, no TIA's, no seizures, no headaches, no temporary blindness one eye,  no slurred speech, no peripheral neuropathy, no chronic pain, no instability of gait, no memory/cognitive dysfunction  Musculoskeletal: no arthritis, no joint swelling, no myalgias, no difficulty walking, normal mobility   Skin:   no rash, no itching, no skin infections, no pressure sores or ulcerations  Psych:   no anxiety, no depression, no nervousness, no unusual recent stress  Eyes:   no blurry vision, no floaters, no recent vision changes, no wears glasses or contacts  ENT:   no hearing loss, no loose or painful teeth, no dentures, last saw dentist no  Hematologic:  no easy bruising, no abnormal bleeding, no clotting disorder, no frequent epistaxis  Endocrine:  no diabetes, does not check CBG's at home     Physical Exam:   BP 169/95 mmHg  Pulse 68  Temp(Src) 98.6 F (37 C) (Oral)  Resp 16  Ht 5\' 8"  (1.727 m)  Wt 65.2 kg (143 lb 11.8 oz)  BMI 21.86 kg/m2  SpO2 98%  General:    well-appearing  HEENT:  Unremarkable   Neck:   no JVD, no bruits, no adenopathy   Chest:   clear to auscultation, symmetrical breath sounds, no wheezes, no rhonchi   CV:   RRR, no  murmur   Abdomen:  soft, non-tender, no masses   Extremities:  warm, well-perfused, pulses palpable, no lower extremity edema  Rectal/GU  Deferred  Neuro:   Grossly non-focal and symmetrical  throughout  Skin:   Clean and dry, no rashes, no breakdown  Diagnostic Tests:  Cardiac Catheterization Procedure Note  Name: Travis Harrison MRN: [id number] DOB: [birth year]  Procedure: Left Heart Cath, Selective Coronary Angiography, LV angiography  Indication: Unstable angina  Procedural Details: The right wrist was prepped, draped, and anesthetized with 1% lidocaine. Using the modified Seldinger technique, a 5/6 French Slender sheath was introduced into the right radial artery. 3 mg of verapamil was administered through the sheath, weight-based unfractionated heparin was administered intravenously. Standard Judkins catheters were used for selective coronary angiography and left ventriculography. Catheter exchanges were performed over an exchange length guidewire. There were no immediate procedural complications. A TR band was used for radial hemostasis at the completion of the procedure. The patient was transferred to the post catheterization recovery area for further monitoring.  Procedural Findings: Hemodynamics: AO 124/80 LV 124/18  Coronary angiography: Coronary dominance: right  Left mainstem: 90% proximal stenosis   Left anterior descending (LAD): Patent to the LV apex. There is mild diffuse mid-vessel stenosis of 30-40%. The first diagonal is large with mild ostial stenosis.  Left circumflex (LCx): The circumflex is medium in caliber. There is diffuse disease through the mid-vessel of no more than 50%. The first OM is large in caliber and distribution.  Right coronary artery (RCA): Dominant vessel. There are diffuse irregularities with 30% distal stenosis. The PDA has 50% stenosis. The PLA is patent.   Left ventriculography: Left ventricular systolic function is normal, LVEF is estimated at 65%, there is no significant mitral regurgitation   Estimated Blood Loss: minimal  Final Conclusions:  1. Severe left main stenosis 2.  Nonobstructive LAD/LCx/RCA stenosis 3. Normal/vigorous LV function  Recommendations: TCTS consult for CABG. Anatomy is amenable to PCI with low Syntax  score, but would favor CABG. Will d/w patient and family.  Sherren Mocha MD, [facility] 03/03/2014, 3:08 PM    Impression:  Patient has severe left main coronary artery stenosis with preserved left ventricular systolic function. He presents with crescendo symptoms of angina pectoris culminating with a relatively prolonged episode of chest discomfort that occurred yesterday evening he was at work. The patient is otherwise healthy and lives a physically active lifestyle. I agree that he would best be treated using surgical revascularization.  Plan:  I have reviewed the indications, risks, and potential benefits of coronary artery bypass grafting with the patient and his wife.  Alternative treatment strategies have been discussed.  The patient understands and accepts all potential associated risks of surgery including but not limited to risk of death, stroke or other neurologic complication, myocardial infarction, congestive heart failure, respiratory failure, renal failure, bleeding requiring blood transfusion and/or reexploration, aortic dissection or other major vascular complication, arrhythmia, heart block or bradycardia requiring permanent pacemaker, pneumonia, pleural effusion, wound infection, pulmonary embolus or other thromboembolic complication, chronic pain or other delayed complications related to median sternotomy, or the late recurrence of symptomatic ischemic heart disease and/or congestive heart failure.  The importance of long term risk modification have been emphasized.  All questions answered.  We plan to proceed with surgery tomorrow.   I spent in excess of 120 minutes during the conduct of this hospital consultation and >50% of this time involved direct face-to-face encounter for counseling and/or coordination of the patient's  care.   Valentina Gu. Roxy Manns, MD 03/03/2014 4:16 PM

## 2014-03-03 NOTE — H&P (View-Only) (Signed)
[address], [place], [place]  [address] Phone: [phone] Fax:  [phone]  Date:  03/03/2014   ID:  Travis Harrison, DOB [birth year], MRN [id number]  PCP:  No primary care provider on file.  Cardiologist:  Fransico Him, MD    History of Present Illness: Travis Harrison is a 63 y.o. male with a history of HTN who presents today for evaluation of chest pressure.  It started initially about 5-6 weeks ago raking leaves and only lasted 3-4 minutes.  He had an episode Monday at work while lifting cases of liquor and lasted 5-10 minutes.  He did the same thing again at work yesterday and then last night and started up lasting anywhere from 10-20 minutes.  He says that his vision will get blurry.  The pressure is midsternal with and he feels some discomfort and tingling in his left wrist associated with the pressure.  He says that his face gets very red and he gets hot and he has to sit down.  He said the second episode was very painful and he still has some residual discomfort this am similar to what he has last night.  He thinks he has some mild SOB with the pressure and has diaphoresis everytime he gets the pain.  Sometimes he thinks that belching will help but it mainly only resolves with rest.  He also notices that his heart races when he gets the chest discomfort but the chest discomfort comes first.     Wt Readings from Last 3 Encounters:  03/03/14 147 lb 6.4 oz (66.86 kg)     Past Medical History  Diagnosis Date  . Hyperlipidemia   . Hypertension     Current Outpatient Prescriptions  Medication Sig Dispense Refill  . atorvastatin (LIPITOR) 10 MG tablet Take 10 mg by mouth daily.    Marland Kitchen buPROPion (WELLBUTRIN SR) 100 MG 12 hr tablet Take 100 mg by mouth daily.    Marland Kitchen lisinopril (PRINIVIL,ZESTRIL) 20 MG tablet Take 20 mg by mouth daily.    Marland Kitchen VIAGRA 100 MG tablet Take 100 mg by mouth daily as needed. Erectile Dysfunction    . naproxen sodium (ANAPROX) 220 MG tablet Take  220 mg by mouth daily as needed (pain).    . vitamin C (ASCORBIC ACID) 500 MG tablet Take 500 mg by mouth daily.     No current facility-administered medications for this visit.    Allergies:   No Known Allergies  Social History:  The patient  reports that he quit smoking about 15 years ago. He does not have any smokeless tobacco history on file. He reports that he drinks about 24.0 oz of alcohol per week.   Family History:  The patient's family history includes CVA in his father; Heart attack in his father and mother; Heart disease in his father and mother; Hypertension in his father.   ROS:  Please see the history of present illness.      All other systems reviewed and negative.   PHYSICAL EXAM: VS:  BP 124/90 mmHg  Pulse 65  Ht 5\' 8"  (1.727 m)  Wt 147 lb 6.4 oz (66.86 kg)  BMI 22.42 kg/m2 Well nourished, well developed, in no acute distress HEENT: normal Neck: no JVD Cardiac:  normal S1, S2; RRR; no murmur Lungs:  clear to auscultation bilaterally, no wheezing, rhonchi or rales Abd: soft, nontender, no hepatomegaly Ext: no edema Skin: warm and dry Neuro:  CNs 2-12 intact, no focal  abnormalities noted  EKG:  NSR at 65bpm with LVH and T wave inversions in V1 and V2, V4-V6 and aVL.  T wave changes new from 08/2013  ASSESSMENT AND PLAN:  1.  Crescendo angina starting about 5 weeks ago but over the past week occurring with any exertion.  He had an episode last night and still has some mild residual discomfort today.  EKG shows new changes with T wave inversions in the anterior leads which are new from May 2015.  His CRF include HTN, dyslipidemia, remote tobacco use up to 2.5 ppd and strong family history of CAD with his dad having an MI in his 34's.  I have recommended that we proceed with cardiac cath today given his continued chest discomfort and new EKG changes.  He will be admitted to [facility] and proceed with cath.  Cardiac catheterization was discussed with the patient fully including  risks on myocardial infarction, death, stroke, bleeding, arrhythmia, dye allergy, renal insufficiency or bleeding.  All patient questions and concerns were discussed and the patient.   understands and is willing to proceed.  Will admit through the ER.  Patient given 4 baby ASA in office as well as SL NTG x 1.  Will start IV Heparin and NTG gtt, lopressor 12.5mg  BID, asa 81mg  daily, continue ACE I and increase Lipitor to 40mg  daily.  Check FLP and Hbg A1C in am. 2.  HTN with borderline control 3.  Dyslipidemia   Signed, Fransico Him, MD [facility] 03/03/2014 10:04 AM

## 2014-03-03 NOTE — Progress Notes (Signed)
TR band deflated following the removal protocol. Site level 0, no bleeding.  Heparin to be restarted two hours post TR band removal. Tegaderm dressing placed over right radial site. Patient educated to keep arm at heart level and to notify RN if he notices any bleeding. Will continue to monitor. Roxan Hockey, RN

## 2014-03-03 NOTE — Progress Notes (Signed)
Pre-op Cardiac Surgery  Carotid Findings:  Right = 40-59% ICA stenosis, mid scale. Left = 1-39% ICA stenosis. Antegrade vertebral flow bilaterally, however, the left vertebral demonstrates atypical and diminished waveform. Etiology unknown.    Upper Extremity Right Left  Brachial Pressures N/A - recent radial cath 126  Radial Waveforms Tri Tri  Ulnar Waveforms Tri Tri  Palmar Arch (Allen's Test) N/A Obliterates with radial compression, normal with ulnar compression   Findings:  Palpable pedal pulses.   Landry Mellow, RDMS, RVT 03/03/2014

## 2014-03-03 NOTE — Progress Notes (Signed)
PFT's not completed today. PFT's need to be completed ASAP in a.m. Night RN made aware and will pass message along. Roxan Hockey, RN

## 2014-03-03 NOTE — CV Procedure (Signed)
    Cardiac Catheterization Procedure Note  Name: Travis Harrison MRN: [id number] DOB: 11-Oct-1950  Procedure: Left Heart Cath, Selective Coronary Angiography, LV angiography  Indication: Unstable angina   Procedural Details: The right wrist was prepped, draped, and anesthetized with 1% lidocaine. Using the modified Seldinger technique, a 5/6 French Slender sheath was introduced into the right radial artery. 3 mg of verapamil was administered through the sheath, weight-based unfractionated heparin was administered intravenously. Standard Judkins catheters were used for selective coronary angiography and left ventriculography. Catheter exchanges were performed over an exchange length guidewire. There were no immediate procedural complications. A TR band was used for radial hemostasis at the completion of the procedure.  The patient was transferred to the post catheterization recovery area for further monitoring.  Procedural Findings: Hemodynamics: AO 124/80 LV 124/18  Coronary angiography: Coronary dominance: right  Left mainstem: 90% proximal stenosis   Left anterior descending (LAD): Patent to the LV apex. There is mild diffuse mid-vessel stenosis of 30-40%. The first diagonal is large with mild ostial stenosis.  Left circumflex (LCx): The circumflex is medium in caliber. There is diffuse disease through the mid-vessel of no more than 50%. The first OM is large in caliber and distribution.  Right coronary artery (RCA): Dominant vessel. There are diffuse irregularities with 30% distal stenosis. The PDA has 50% stenosis. The PLA is patent.   Left ventriculography: Left ventricular systolic function is normal, LVEF is estimated at 65%, there is no significant mitral regurgitation   Estimated Blood Loss: minimal  Final Conclusions:   1. Severe left main stenosis 2. Nonobstructive LAD/LCx/RCA stenosis 3. Normal/vigorous LV function  Recommendations: TCTS consult for CABG. Anatomy is  amenable to PCI with low Syntax score, but would favor CABG. Will d/w patient and family.  Sherren Mocha MD, [facility] 03/03/2014, 3:08 PM

## 2014-03-03 NOTE — Progress Notes (Signed)
[address], [place],   [address] Phone: [phone] Fax:  [phone]  Date:  03/03/2014   ID:  Travis Harrison, DOB 1951/03/16, MRN [id number]  PCP:  No primary care provider on file.  Cardiologist:  Fransico Him, MD    History of Present Illness: Travis Harrison is a 63 y.o. male with a history of HTN who presents today for evaluation of chest pressure.  It started initially about 5-6 weeks ago raking leaves and only lasted 3-4 minutes.  He had an episode Monday at work while lifting cases of liquor and lasted 5-10 minutes.  He did the same thing again at work yesterday and then last night and started up lasting anywhere from 10-20 minutes.  He says that his vision will get blurry.  The pressure is midsternal with and he feels some discomfort and tingling in his left wrist associated with the pressure.  He says that his face gets very red and he gets hot and he has to sit down.  He said the second episode was very painful and he still has some residual discomfort this am similar to what he has last night.  He thinks he has some mild SOB with the pressure and has diaphoresis everytime he gets the pain.  Sometimes he thinks that belching will help but it mainly only resolves with rest.  He also notices that his heart races when he gets the chest discomfort but the chest discomfort comes first.     Wt Readings from Last 3 Encounters:  03/03/14 147 lb 6.4 oz (66.86 kg)     Past Medical History  Diagnosis Date  . Hyperlipidemia   . Hypertension     Current Outpatient Prescriptions  Medication Sig Dispense Refill  . atorvastatin (LIPITOR) 10 MG tablet Take 10 mg by mouth daily.    Marland Kitchen buPROPion (WELLBUTRIN SR) 100 MG 12 hr tablet Take 100 mg by mouth daily.    Marland Kitchen lisinopril (PRINIVIL,ZESTRIL) 20 MG tablet Take 20 mg by mouth daily.    Marland Kitchen VIAGRA 100 MG tablet Take 100 mg by mouth daily as needed. Erectile Dysfunction    . naproxen sodium (ANAPROX) 220 MG tablet Take  220 mg by mouth daily as needed (pain).    . vitamin C (ASCORBIC ACID) 500 MG tablet Take 500 mg by mouth daily.     No current facility-administered medications for this visit.    Allergies:   No Known Allergies  Social History:  The patient  reports that he quit smoking about 15 years ago. He does not have any smokeless tobacco history on file. He reports that he drinks about 24.0 oz of alcohol per week.   Family History:  The patient's family history includes CVA in his father; Heart attack in his father and mother; Heart disease in his father and mother; Hypertension in his father.   ROS:  Please see the history of present illness.      All other systems reviewed and negative.   PHYSICAL EXAM: VS:  BP 124/90 mmHg  Pulse 65  Ht 5\' 8"  (1.727 m)  Wt 147 lb 6.4 oz (66.86 kg)  BMI 22.42 kg/m2 Well nourished, well developed, in no acute distress HEENT: normal Neck: no JVD Cardiac:  normal S1, S2; RRR; no murmur Lungs:  clear to auscultation bilaterally, no wheezing, rhonchi or rales Abd: soft, nontender, no hepatomegaly Ext: no edema Skin: warm and dry Neuro:  CNs 2-12 intact, no focal  abnormalities noted  EKG:  NSR at 65bpm with LVH and T wave inversions in V1 and V2, V4-V6 and aVL.  T wave changes new from 08/2013  ASSESSMENT AND PLAN:  1.  Crescendo angina starting about 5 weeks ago but over the past week occurring with any exertion.  He had an episode last night and still has some mild residual discomfort today.  EKG shows new changes with T wave inversions in the anterior leads which are new from May 2015.  His CRF include HTN, dyslipidemia, remote tobacco use up to 2.5 ppd and strong family history of CAD with his dad having an MI in his 74's.  I have recommended that we proceed with cardiac cath today given his continued chest discomfort and new EKG changes.  He will be admitted to [facility] and proceed with cath.  Cardiac catheterization was discussed with the patient fully including  risks on myocardial infarction, death, stroke, bleeding, arrhythmia, dye allergy, renal insufficiency or bleeding.  All patient questions and concerns were discussed and the patient.   understands and is willing to proceed.  Will admit through the ER.  Patient given 4 baby ASA in office as well as SL NTG x 1.  Will start IV Heparin and NTG gtt, lopressor 12.5mg  BID, asa 81mg  daily, continue ACE I and increase Lipitor to 40mg  daily.  Check FLP and Hbg A1C in am. 2.  HTN with borderline control 3.  Dyslipidemia   Signed, Fransico Him, MD [facility] 03/03/2014 10:04 AM

## 2014-03-04 ENCOUNTER — Inpatient Hospital Stay (HOSPITAL_COMMUNITY): Payer: BC Managed Care – PPO

## 2014-03-04 ENCOUNTER — Inpatient Hospital Stay (HOSPITAL_COMMUNITY): Payer: BC Managed Care – PPO | Admitting: Certified Registered Nurse Anesthetist

## 2014-03-04 ENCOUNTER — Encounter (HOSPITAL_COMMUNITY): Payer: Self-pay | Admitting: Certified Registered Nurse Anesthetist

## 2014-03-04 ENCOUNTER — Encounter (HOSPITAL_COMMUNITY)
Admission: EM | Disposition: A | Discharge status: Home / Self Care | Payer: BC Managed Care – PPO | Source: Home / Self Care | Attending: Thoracic Surgery (Cardiothoracic Vascular Surgery)

## 2014-03-04 DIAGNOSIS — Z951 Presence of aortocoronary bypass graft: Secondary | ICD-10-CM

## 2014-03-04 DIAGNOSIS — I2510 Atherosclerotic heart disease of native coronary artery without angina pectoris: Secondary | ICD-10-CM

## 2014-03-04 DIAGNOSIS — I200 Unstable angina: Secondary | ICD-10-CM

## 2014-03-04 DIAGNOSIS — E785 Hyperlipidemia, unspecified: Secondary | ICD-10-CM

## 2014-03-04 DIAGNOSIS — I10 Essential (primary) hypertension: Secondary | ICD-10-CM

## 2014-03-04 HISTORY — DX: Presence of aortocoronary bypass graft: Z95.1

## 2014-03-04 HISTORY — PX: INTRAOPERATIVE TRANSESOPHAGEAL ECHOCARDIOGRAM: SHX5062

## 2014-03-04 HISTORY — PX: CORONARY ARTERY BYPASS GRAFT: SHX141

## 2014-03-04 LAB — CBC
HCT: 41.6 % (ref 39.0–52.0)
HCT: 33.2 % — ABNORMAL LOW (ref 39.0–52.0)
HEMOGLOBIN: 13.9 g/dL (ref 13.0–17.0)
Hemoglobin: 11.4 g/dL — ABNORMAL LOW (ref 13.0–17.0)
MCH: 30.2 pg (ref 26.0–34.0)
MCH: 30.6 pg (ref 26.0–34.0)
MCHC: 33.4 g/dL (ref 30.0–36.0)
MCHC: 34.3 g/dL (ref 30.0–36.0)
MCV: 90.4 fL (ref 78.0–100.0)
MCV: 89 fL (ref 78.0–100.0)
PLATELETS: 232 10*3/uL (ref 150–400)
Platelets: 189 10*3/uL (ref 150–400)
RBC: 4.6 MIL/uL (ref 4.22–5.81)
RBC: 3.73 MIL/uL — ABNORMAL LOW (ref 4.22–5.81)
RDW: 13.1 % (ref 11.5–15.5)
RDW: 12.9 % (ref 11.5–15.5)
WBC: 6 10*3/uL (ref 4.0–10.5)
WBC: 13.1 10*3/uL — ABNORMAL HIGH (ref 4.0–10.5)

## 2014-03-04 LAB — POCT I-STAT, CHEM 8
BUN: 6 mg/dL (ref 6–23)
BUN: 5 mg/dL — ABNORMAL LOW (ref 6–23)
BUN: 6 mg/dL (ref 6–23)
BUN: 5 mg/dL — ABNORMAL LOW (ref 6–23)
CREATININE: 0.6 mg/dL (ref 0.50–1.35)
Calcium, Ion: 1.29 mmol/L (ref 1.13–1.30)
Calcium, Ion: 1 mmol/L — ABNORMAL LOW (ref 1.13–1.30)
Calcium, Ion: 1.22 mmol/L (ref 1.13–1.30)
Calcium, Ion: 1.14 mmol/L (ref 1.13–1.30)
Chloride: 102 mEq/L (ref 96–112)
Chloride: 102 mEq/L (ref 96–112)
Chloride: 94 mEq/L — ABNORMAL LOW (ref 96–112)
Chloride: 99 mEq/L (ref 96–112)
Creatinine, Ser: 0.5 mg/dL (ref 0.50–1.35)
Creatinine, Ser: 0.5 mg/dL (ref 0.50–1.35)
Creatinine, Ser: 0.5 mg/dL (ref 0.50–1.35)
Glucose, Bld: 99 mg/dL (ref 70–99)
Glucose, Bld: 107 mg/dL — ABNORMAL HIGH (ref 70–99)
Glucose, Bld: 88 mg/dL (ref 70–99)
Glucose, Bld: 124 mg/dL — ABNORMAL HIGH (ref 70–99)
HCT: 40 % (ref 39.0–52.0)
HCT: 26 % — ABNORMAL LOW (ref 39.0–52.0)
HCT: 36 % — ABNORMAL LOW (ref 39.0–52.0)
HCT: 30 % — ABNORMAL LOW (ref 39.0–52.0)
Hemoglobin: 13.6 g/dL (ref 13.0–17.0)
Hemoglobin: 12.2 g/dL — ABNORMAL LOW (ref 13.0–17.0)
Hemoglobin: 8.8 g/dL — ABNORMAL LOW (ref 13.0–17.0)
Hemoglobin: 10.2 g/dL — ABNORMAL LOW (ref 13.0–17.0)
Potassium: 3.9 mEq/L (ref 3.7–5.3)
Potassium: 3.9 mEq/L (ref 3.7–5.3)
Potassium: 4.6 mEq/L (ref 3.7–5.3)
Potassium: 3.8 mEq/L (ref 3.7–5.3)
SODIUM: 136 meq/L — AB (ref 137–147)
Sodium: 136 mEq/L — ABNORMAL LOW (ref 137–147)
Sodium: 129 mEq/L — ABNORMAL LOW (ref 137–147)
Sodium: 136 mEq/L — ABNORMAL LOW (ref 137–147)
TCO2: 29 mmol/L (ref 0–100)
TCO2: 22 mmol/L (ref 0–100)
TCO2: 26 mmol/L (ref 0–100)
TCO2: 24 mmol/L (ref 0–100)

## 2014-03-04 LAB — PULMONARY FUNCTION TEST
FEF 25-75 POST: 2.69 L/s
FEF 25-75 Pre: 2.01 L/sec
FEF2575-%Change-Post: 33 %
FEF2575-%Pred-Post: 106 %
FEF2575-%Pred-Pre: 79 %
FEV1-%Change-Post: 6 %
FEV1-%PRED-PRE: 86 %
FEV1-%Pred-Post: 92 %
FEV1-PRE: 2.75 L
FEV1-Post: 2.93 L
FEV1FVC-%Change-Post: 8 %
FEV1FVC-%PRED-PRE: 99 %
FEV6-%CHANGE-POST: 0 %
FEV6-%PRED-POST: 89 %
FEV6-%Pred-Pre: 88 %
FEV6-POST: 3.61 L
FEV6-Pre: 3.58 L
FEV6FVC-%CHANGE-POST: 2 %
FEV6FVC-%PRED-PRE: 102 %
FEV6FVC-%Pred-Post: 104 %
FVC-%CHANGE-POST: -1 %
FVC-%PRED-POST: 85 %
FVC-%Pred-Pre: 86 %
FVC-POST: 3.63 L
FVC-Pre: 3.68 L
PRE FEV1/FVC RATIO: 75 %
Post FEV1/FVC ratio: 81 %
Post FEV6/FVC ratio: 100 %
Pre FEV6/FVC Ratio: 97 %

## 2014-03-04 LAB — POCT I-STAT 3, ART BLOOD GAS (G3+)
Bicarbonate: 24.8 mEq/L — ABNORMAL HIGH (ref 20.0–24.0)
Bicarbonate: 24.8 mEq/L — ABNORMAL HIGH (ref 20.0–24.0)
O2 Saturation: 100 %
O2 Saturation: 98 %
PCO2 ART: 42.1 mmHg (ref 35.0–45.0)
PH ART: 7.378 (ref 7.350–7.450)
Patient temperature: 36.8
TCO2: 26 mmol/L (ref 0–100)
TCO2: 26 mmol/L (ref 0–100)
pCO2 arterial: 37.6 mmHg (ref 35.0–45.0)
pH, Arterial: 7.427 (ref 7.350–7.450)
pO2, Arterial: 278 mmHg — ABNORMAL HIGH (ref 80.0–100.0)
pO2, Arterial: 99 mmHg (ref 80.0–100.0)

## 2014-03-04 LAB — LIPID PANEL
CHOL/HDL RATIO: 2.9 ratio
Cholesterol: 171 mg/dL (ref 0–200)
HDL: 58 mg/dL (ref >39)
LDL Cholesterol: 98 mg/dL (ref 0–99)
TRIGLYCERIDES: 73 mg/dL (ref <150)
VLDL: 15 mg/dL (ref 0–40)

## 2014-03-04 LAB — POCT I-STAT 4, (NA,K, GLUC, HGB,HCT)
GLUCOSE: 98 mg/dL (ref 70–99)
HCT: 33 % — ABNORMAL LOW (ref 39.0–52.0)
Hemoglobin: 11.2 g/dL — ABNORMAL LOW (ref 13.0–17.0)
Potassium: 3.4 mEq/L — ABNORMAL LOW (ref 3.7–5.3)
Sodium: 139 mEq/L (ref 137–147)

## 2014-03-04 LAB — BASIC METABOLIC PANEL
Anion gap: 13 (ref 5–15)
BUN: 9 mg/dL (ref 6–23)
CHLORIDE: 101 meq/L (ref 96–112)
CO2: 26 mEq/L (ref 19–32)
Calcium: 9.2 mg/dL (ref 8.4–10.5)
Creatinine, Ser: 0.7 mg/dL (ref 0.50–1.35)
GFR calc Af Amer: >90 mL/min (ref >90)
Glucose, Bld: 90 mg/dL (ref 70–99)
POTASSIUM: 4.4 meq/L (ref 3.7–5.3)
SODIUM: 140 meq/L (ref 137–147)

## 2014-03-04 LAB — PROTIME-INR
INR: 1.06 (ref 0.00–1.49)
INR: 1.35 (ref 0.00–1.49)
Prothrombin Time: 14 seconds (ref 11.6–15.2)
Prothrombin Time: 16.8 seconds — ABNORMAL HIGH (ref 11.6–15.2)

## 2014-03-04 LAB — APTT: aPTT: 34 seconds (ref 24–37)

## 2014-03-04 LAB — HEPARIN LEVEL (UNFRACTIONATED): Heparin Unfractionated: 0.11 IU/mL — ABNORMAL LOW (ref 0.30–0.70)

## 2014-03-04 LAB — PLATELET COUNT: Platelets: 193 10*3/uL (ref 150–400)

## 2014-03-04 LAB — HEMOGLOBIN AND HEMATOCRIT, BLOOD
HCT: 29.3 % — ABNORMAL LOW (ref 39.0–52.0)
Hemoglobin: 10 g/dL — ABNORMAL LOW (ref 13.0–17.0)

## 2014-03-04 LAB — POCT I-STAT GLUCOSE
Glucose, Bld: 115 mg/dL — ABNORMAL HIGH (ref 70–99)
Operator id: 3402

## 2014-03-04 SURGERY — CORONARY ARTERY BYPASS GRAFTING (CABG)
Anesthesia: General | Site: Chest

## 2014-03-04 MED ORDER — MAGNESIUM SULFATE 4 GM/100ML IV SOLN
4.0000 g | Freq: Once | INTRAVENOUS | Status: AC
  Administered 2014-03-04: 4 g via INTRAVENOUS
  Filled 2014-03-04: qty 100

## 2014-03-04 MED ORDER — DEXTROSE 5 % IV SOLN
1.5000 g | Freq: Two times a day (BID) | INTRAVENOUS | Status: AC
  Administered 2014-03-04 – 2014-03-06 (×4): 1.5 g via INTRAVENOUS
  Filled 2014-03-04 (×4): qty 1.5

## 2014-03-04 MED ORDER — ACETAMINOPHEN 160 MG/5ML PO SOLN: 650.0000 mg | Freq: Once | ORAL | Status: AC

## 2014-03-04 MED ORDER — LACTATED RINGERS IV SOLN
INTRAVENOUS | Status: DC | PRN
  Administered 2014-03-04 (×2): via INTRAVENOUS

## 2014-03-04 MED ORDER — ASPIRIN EC 325 MG PO TBEC
325.0000 mg | DELAYED_RELEASE_TABLET | Freq: Every day | ORAL | Status: DC
  Administered 2014-03-05 – 2014-03-06 (×2): 325 mg via ORAL
  Filled 2014-03-04 (×2): qty 1

## 2014-03-04 MED ORDER — PHENYLEPHRINE HCL 10 MG/ML IJ SOLN
INTRAMUSCULAR | Status: DC | PRN
  Administered 2014-03-04: 40 ug via INTRAVENOUS
  Administered 2014-03-04 (×2): 80 ug via INTRAVENOUS

## 2014-03-04 MED ORDER — FENTANYL CITRATE 0.05 MG/ML IJ SOLN
INTRAMUSCULAR | Status: AC
Start: 2014-03-04 — End: 2014-03-04
  Filled 2014-03-04: qty 5

## 2014-03-04 MED ORDER — SODIUM CHLORIDE 0.9 % IV SOLN
INTRAVENOUS | Status: DC | PRN
  Administered 2014-03-04: 18:00:00 via INTRAVENOUS

## 2014-03-04 MED ORDER — SODIUM CHLORIDE 0.9 % IV SOLN
250.0000 mL | INTRAVENOUS | Status: DC
  Administered 2014-03-04: 100 mL/h via INTRAVENOUS

## 2014-03-04 MED ORDER — ROCURONIUM BROMIDE 100 MG/10ML IV SOLN
INTRAVENOUS | Status: DC | PRN
Start: 2014-03-04 — End: 2014-03-04
  Administered 2014-03-04: 50 mg via INTRAVENOUS

## 2014-03-04 MED ORDER — VECURONIUM BROMIDE 10 MG IV SOLR
INTRAVENOUS | Status: DC | PRN
  Administered 2014-03-04 (×6): 5 mg via INTRAVENOUS

## 2014-03-04 MED ORDER — MIDAZOLAM HCL 10 MG/2ML IJ SOLN
INTRAMUSCULAR | Status: AC
  Filled 2014-03-04: qty 2

## 2014-03-04 MED ORDER — LACTATED RINGERS IV SOLN
INTRAVENOUS | Status: DC
  Administered 2014-03-04: 13:00:00 via INTRAVENOUS

## 2014-03-04 MED ORDER — SODIUM CHLORIDE 0.9 % IV SOLN
INTRAVENOUS | Status: DC
  Administered 2014-03-04: 20 mL/h via INTRAVENOUS

## 2014-03-04 MED ORDER — MORPHINE SULFATE 2 MG/ML IJ SOLN
1.0000 mg | INTRAMUSCULAR | Status: DC | PRN
  Administered 2014-03-04 (×2): 2 mg via INTRAVENOUS
  Filled 2014-03-04 (×2): qty 1

## 2014-03-04 MED ORDER — INSULIN REGULAR BOLUS VIA INFUSION
0.0000 [IU] | Freq: Three times a day (TID) | INTRAVENOUS | Status: DC
  Filled 2014-03-04: qty 10

## 2014-03-04 MED ORDER — DOCUSATE SODIUM 100 MG PO CAPS
200.0000 mg | ORAL_CAPSULE | Freq: Every day | ORAL | Status: DC
  Administered 2014-03-06 – 2014-03-07 (×2): 200 mg via ORAL
  Filled 2014-03-04 (×3): qty 2

## 2014-03-04 MED ORDER — POTASSIUM CHLORIDE 10 MEQ/50ML IV SOLN
10.0000 meq | INTRAVENOUS | Status: AC
  Administered 2014-03-04 (×3): 10 meq via INTRAVENOUS

## 2014-03-04 MED ORDER — HEPARIN SODIUM (PORCINE) 1000 UNIT/ML IJ SOLN
INTRAMUSCULAR | Status: DC | PRN
  Administered 2014-03-04: 20000 [IU] via INTRAVENOUS

## 2014-03-04 MED ORDER — PROPOFOL 10 MG/ML IV BOLUS
INTRAVENOUS | Status: DC | PRN
  Administered 2014-03-04: 100 mg via INTRAVENOUS
  Administered 2014-03-04: 40 mg via INTRAVENOUS

## 2014-03-04 MED ORDER — MIDAZOLAM HCL 2 MG/2ML IJ SOLN
INTRAMUSCULAR | Status: AC
  Administered 2014-03-04: 2 mg
  Filled 2014-03-04: qty 2

## 2014-03-04 MED ORDER — PHENYLEPHRINE 40 MCG/ML (10ML) SYRINGE FOR IV PUSH (FOR BLOOD PRESSURE SUPPORT)
PREFILLED_SYRINGE | INTRAVENOUS | Status: AC
  Filled 2014-03-04: qty 30

## 2014-03-04 MED ORDER — FENTANYL CITRATE 0.05 MG/ML IJ SOLN
INTRAMUSCULAR | Status: DC | PRN
  Administered 2014-03-04: 250 ug via INTRAVENOUS
  Administered 2014-03-04: 300 ug via INTRAVENOUS
  Administered 2014-03-04: 500 ug via INTRAVENOUS
  Administered 2014-03-04: 200 ug via INTRAVENOUS
  Administered 2014-03-04 (×2): 250 ug via INTRAVENOUS

## 2014-03-04 MED ORDER — ALBUMIN HUMAN 5 % IV SOLN
250.0000 mL | INTRAVENOUS | Status: AC | PRN
  Administered 2014-03-04: 250 mL via INTRAVENOUS

## 2014-03-04 MED ORDER — EPHEDRINE SULFATE 50 MG/ML IJ SOLN
INTRAMUSCULAR | Status: AC
  Filled 2014-03-04: qty 1

## 2014-03-04 MED ORDER — STERILE WATER FOR INJECTION IJ SOLN
INTRAMUSCULAR | Status: AC
  Filled 2014-03-04: qty 10

## 2014-03-04 MED ORDER — PANTOPRAZOLE SODIUM 40 MG PO TBEC
40.0000 mg | DELAYED_RELEASE_TABLET | Freq: Every day | ORAL | Status: DC
  Administered 2014-03-06 – 2014-03-08 (×3): 40 mg via ORAL
  Filled 2014-03-04 (×2): qty 1

## 2014-03-04 MED ORDER — PHENYLEPHRINE HCL 10 MG/ML IJ SOLN
0.0000 ug/min | INTRAMUSCULAR | Status: DC
  Administered 2014-03-04: 20 ug/min via INTRAVENOUS
  Filled 2014-03-04: qty 2

## 2014-03-04 MED ORDER — CETYLPYRIDINIUM CHLORIDE 0.05 % MT LIQD
7.0000 mL | Freq: Four times a day (QID) | OROMUCOSAL | Status: DC
  Administered 2014-03-05 (×4): 7 mL via OROMUCOSAL

## 2014-03-04 MED ORDER — VANCOMYCIN HCL IN DEXTROSE 1-5 GM/200ML-% IV SOLN
1000.0000 mg | Freq: Once | INTRAVENOUS | Status: AC
  Administered 2014-03-05: 1000 mg via INTRAVENOUS
  Filled 2014-03-04: qty 200

## 2014-03-04 MED ORDER — OXYCODONE HCL 5 MG PO TABS
5.0000 mg | ORAL_TABLET | ORAL | Status: DC | PRN
  Administered 2014-03-05 – 2014-03-06 (×4): 10 mg via ORAL
  Administered 2014-03-07 (×2): 5 mg via ORAL
  Filled 2014-03-04 (×2): qty 2
  Filled 2014-03-04: qty 1
  Filled 2014-03-04 (×2): qty 2
  Filled 2014-03-04: qty 1

## 2014-03-04 MED ORDER — BISACODYL 10 MG RE SUPP: 10.0000 mg | Freq: Every day | RECTAL | Status: DC

## 2014-03-04 MED ORDER — MIDAZOLAM HCL 5 MG/5ML IJ SOLN
INTRAMUSCULAR | Status: DC | PRN
  Administered 2014-03-04: 1 mg via INTRAVENOUS
  Administered 2014-03-04: 2 mg via INTRAVENOUS
  Administered 2014-03-04: 3 mg via INTRAVENOUS
  Administered 2014-03-04 (×4): 1 mg via INTRAVENOUS

## 2014-03-04 MED ORDER — LACTATED RINGERS IV SOLN
INTRAVENOUS | Status: DC
  Administered 2014-03-04: 20 mL/h via INTRAVENOUS

## 2014-03-04 MED ORDER — ASPIRIN 81 MG PO CHEW: 324.0000 mg | CHEWABLE_TABLET | Freq: Every day | ORAL | Status: DC

## 2014-03-04 MED ORDER — FENTANYL CITRATE 0.05 MG/ML IJ SOLN
INTRAMUSCULAR | Status: AC
  Administered 2014-03-04: 100 ug
  Filled 2014-03-04: qty 2

## 2014-03-04 MED ORDER — FENTANYL CITRATE 0.05 MG/ML IJ SOLN
INTRAMUSCULAR | Status: AC
  Filled 2014-03-04: qty 5

## 2014-03-04 MED ORDER — ACETAMINOPHEN 500 MG PO TABS
1000.0000 mg | ORAL_TABLET | Freq: Four times a day (QID) | ORAL | Status: DC
  Administered 2014-03-05 – 2014-03-07 (×9): 1000 mg via ORAL
  Filled 2014-03-04 (×22): qty 2

## 2014-03-04 MED ORDER — HEPARIN SODIUM (PORCINE) 1000 UNIT/ML IJ SOLN
INTRAMUSCULAR | Status: AC
  Filled 2014-03-04: qty 1

## 2014-03-04 MED ORDER — LACTATED RINGERS IV SOLN: 500.0000 mL | Freq: Once | INTRAVENOUS | Status: AC | PRN

## 2014-03-04 MED ORDER — NITROGLYCERIN IN D5W 200-5 MCG/ML-% IV SOLN
0.0000 ug/min | INTRAVENOUS | Status: DC
  Administered 2014-03-04: 0 ug/min via INTRAVENOUS

## 2014-03-04 MED ORDER — VECURONIUM BROMIDE 10 MG IV SOLR
INTRAVENOUS | Status: AC
  Filled 2014-03-04: qty 10

## 2014-03-04 MED ORDER — EPHEDRINE SULFATE 50 MG/ML IJ SOLN
INTRAMUSCULAR | Status: AC
Start: 2014-03-04 — End: 2014-03-04
  Filled 2014-03-04: qty 1

## 2014-03-04 MED ORDER — SODIUM CHLORIDE 0.9 % IJ SOLN
3.0000 mL | INTRAMUSCULAR | Status: DC | PRN
  Administered 2014-03-05: 3 mL via INTRAVENOUS
  Filled 2014-03-04: qty 3

## 2014-03-04 MED ORDER — CHLORHEXIDINE GLUCONATE 0.12 % MT SOLN
15.0000 mL | Freq: Two times a day (BID) | OROMUCOSAL | Status: DC
  Administered 2014-03-04 – 2014-03-05 (×2): 15 mL via OROMUCOSAL
  Filled 2014-03-04 (×2): qty 15

## 2014-03-04 MED ORDER — ARTIFICIAL TEARS OP OINT
TOPICAL_OINTMENT | OPHTHALMIC | Status: DC | PRN
  Administered 2014-03-04: 1 via OPHTHALMIC

## 2014-03-04 MED ORDER — 0.9 % SODIUM CHLORIDE (POUR BTL) OPTIME
TOPICAL | Status: DC | PRN
  Administered 2014-03-04: 1000 mL

## 2014-03-04 MED ORDER — METOPROLOL TARTRATE 1 MG/ML IV SOLN
2.5000 mg | INTRAVENOUS | Status: DC | PRN
  Filled 2014-03-04: qty 5

## 2014-03-04 MED ORDER — SODIUM CHLORIDE 0.9 % IJ SOLN
INTRAMUSCULAR | Status: AC
  Filled 2014-03-04: qty 10

## 2014-03-04 MED ORDER — BISACODYL 5 MG PO TBEC
10.0000 mg | DELAYED_RELEASE_TABLET | Freq: Every day | ORAL | Status: DC
  Administered 2014-03-06 – 2014-03-07 (×2): 10 mg via ORAL
  Filled 2014-03-04 (×4): qty 2

## 2014-03-04 MED ORDER — ROCURONIUM BROMIDE 50 MG/5ML IV SOLN
INTRAVENOUS | Status: AC
  Filled 2014-03-04: qty 1

## 2014-03-04 MED ORDER — ACETAMINOPHEN 160 MG/5ML PO SOLN: 1000.0000 mg | Freq: Four times a day (QID) | ORAL | Status: DC

## 2014-03-04 MED ORDER — ALBUMIN HUMAN 5 % IV SOLN: INTRAVENOUS | Status: DC | PRN

## 2014-03-04 MED ORDER — DEXMEDETOMIDINE HCL IN NACL 200 MCG/50ML IV SOLN
0.0000 ug/kg/h | INTRAVENOUS | Status: DC
  Administered 2014-03-04: 0.7 ug/kg/h via INTRAVENOUS

## 2014-03-04 MED ORDER — PROPOFOL 10 MG/ML IV BOLUS
INTRAVENOUS | Status: AC
  Filled 2014-03-04: qty 20

## 2014-03-04 MED ORDER — MIDAZOLAM HCL 2 MG/2ML IJ SOLN: 2.0000 mg | INTRAMUSCULAR | Status: DC | PRN

## 2014-03-04 MED ORDER — TRAMADOL HCL 50 MG PO TABS: 50.0000 mg | ORAL_TABLET | ORAL | Status: DC | PRN

## 2014-03-04 MED ORDER — METOPROLOL TARTRATE 12.5 MG HALF TABLET
12.5000 mg | ORAL_TABLET | Freq: Two times a day (BID) | ORAL | Status: DC
  Administered 2014-03-05 – 2014-03-08 (×6): 12.5 mg via ORAL
  Filled 2014-03-04 (×10): qty 1

## 2014-03-04 MED ORDER — ACETAMINOPHEN 650 MG RE SUPP
650.0000 mg | Freq: Once | RECTAL | Status: AC
  Administered 2014-03-04: 650 mg via RECTAL

## 2014-03-04 MED ORDER — LACTATED RINGERS IV SOLN
INTRAVENOUS | Status: DC | PRN
  Administered 2014-03-04: 13:00:00 via INTRAVENOUS

## 2014-03-04 MED ORDER — FAMOTIDINE IN NACL 20-0.9 MG/50ML-% IV SOLN
20.0000 mg | Freq: Two times a day (BID) | INTRAVENOUS | Status: DC
  Filled 2014-03-04: qty 50

## 2014-03-04 MED ORDER — PROTAMINE SULFATE 10 MG/ML IV SOLN
INTRAVENOUS | Status: DC | PRN
  Administered 2014-03-04 (×5): 50 mg via INTRAVENOUS

## 2014-03-04 MED ORDER — ONDANSETRON HCL 4 MG/2ML IJ SOLN
4.0000 mg | Freq: Four times a day (QID) | INTRAMUSCULAR | Status: DC | PRN
  Administered 2014-03-04 – 2014-03-05 (×2): 4 mg via INTRAVENOUS
  Filled 2014-03-04 (×2): qty 2

## 2014-03-04 MED ORDER — METOPROLOL TARTRATE 25 MG/10 ML ORAL SUSPENSION
12.5000 mg | Freq: Two times a day (BID) | ORAL | Status: DC
  Filled 2014-03-04 (×3): qty 5

## 2014-03-04 MED ORDER — SODIUM CHLORIDE 0.9 % IJ SOLN
3.0000 mL | Freq: Two times a day (BID) | INTRAMUSCULAR | Status: DC
  Administered 2014-03-05 – 2014-03-07 (×5): 3 mL via INTRAVENOUS

## 2014-03-04 MED ORDER — ALBUMIN HUMAN 5 % IV SOLN
INTRAVENOUS | Status: DC | PRN
  Administered 2014-03-04 (×2): via INTRAVENOUS

## 2014-03-04 MED ORDER — ALBUTEROL SULFATE (2.5 MG/3ML) 0.083% IN NEBU
2.5000 mg | INHALATION_SOLUTION | Freq: Once | RESPIRATORY_TRACT | Status: AC
  Administered 2014-03-04: 2.5 mg via RESPIRATORY_TRACT

## 2014-03-04 MED ORDER — CHLORHEXIDINE GLUCONATE 4 % EX LIQD
60.0000 mL | Freq: Once | CUTANEOUS | Status: DC
  Filled 2014-03-04: qty 15

## 2014-03-04 MED ORDER — CHLORHEXIDINE GLUCONATE 4 % EX LIQD
60.0000 mL | Freq: Once | CUTANEOUS | Status: AC
  Administered 2014-03-04: 4 via TOPICAL
  Filled 2014-03-04: qty 60

## 2014-03-04 MED ORDER — SODIUM CHLORIDE 0.9 % IV SOLN: INTRAVENOUS | Status: DC | PRN

## 2014-03-04 MED ORDER — SODIUM CHLORIDE 0.9 % IV SOLN
INTRAVENOUS | Status: DC
  Administered 2014-03-04: 0.6 [IU]/h via INTRAVENOUS
  Filled 2014-03-04: qty 2.5

## 2014-03-04 MED ORDER — MORPHINE SULFATE 2 MG/ML IJ SOLN
2.0000 mg | INTRAMUSCULAR | Status: DC | PRN
  Administered 2014-03-05 (×2): 2 mg via INTRAVENOUS
  Filled 2014-03-04 (×2): qty 1

## 2014-03-04 MED ORDER — SODIUM CHLORIDE 0.9 % IJ SOLN
OROMUCOSAL | Status: DC | PRN
  Administered 2014-03-04: 4 mL via TOPICAL

## 2014-03-04 MED ORDER — SODIUM CHLORIDE 0.45 % IV SOLN
INTRAVENOUS | Status: DC
  Administered 2014-03-04: 20 mL/h via INTRAVENOUS

## 2014-03-04 SURGICAL SUPPLY — 120 items
ADAPTER CARDIO PERF ANTE/RETRO (ADAPTER) IMPLANT
APPLIER CLIP 9.375 MED OPEN (MISCELLANEOUS)
APPLIER CLIP 9.375 SM OPEN (CLIP)
ATTRACTOMAT 16X20 MAGNETIC DRP (DRAPES) ×3 IMPLANT
BAG DECANTER FOR FLEXI CONT (MISCELLANEOUS) ×3 IMPLANT
BANDAGE ELASTIC 4 VELCRO ST LF (GAUZE/BANDAGES/DRESSINGS) ×3 IMPLANT
BANDAGE ELASTIC 6 VELCRO ST LF (GAUZE/BANDAGES/DRESSINGS) ×3 IMPLANT
BASKET HEART (ORDER IN 25'S) (MISCELLANEOUS) ×1
BASKET HEART (ORDER IN 25S) (MISCELLANEOUS) ×2 IMPLANT
BENZOIN TINCTURE PRP APPL 2/3 (GAUZE/BANDAGES/DRESSINGS) ×3 IMPLANT
BLADE STERNUM SYSTEM 6 (BLADE) ×3 IMPLANT
BLADE SURG ROTATE 9660 (MISCELLANEOUS) IMPLANT
BNDG GAUZE ELAST 4 BULKY (GAUZE/BANDAGES/DRESSINGS) ×3 IMPLANT
CANISTER SUCTION 2500CC (MISCELLANEOUS) ×3 IMPLANT
CANNULA EZ GLIDE AORTIC 21FR (CANNULA) ×6 IMPLANT
CANNULA GUNDRY RCSP 15FR (MISCELLANEOUS) IMPLANT
CANNULA VENOUS LOW PROF 34X46 (CANNULA) ×3 IMPLANT
CARDIAC SUCTION (MISCELLANEOUS) ×3 IMPLANT
CATH CPB KIT OWEN (MISCELLANEOUS) ×3 IMPLANT
CATH THORACIC 28FR (CATHETERS) IMPLANT
CATH THORACIC 28FR RT ANG (CATHETERS) IMPLANT
CATH THORACIC 36FR (CATHETERS) ×3 IMPLANT
CATH THORACIC 36FR RT ANG (CATHETERS) ×3 IMPLANT
CLIP APPLIE 9.375 MED OPEN (MISCELLANEOUS) IMPLANT
CLIP APPLIE 9.375 SM OPEN (CLIP) IMPLANT
CLIP FOGARTY SPRING 6M (CLIP) ×3 IMPLANT
CLIP RETRACTION 3.0MM CORONARY (MISCELLANEOUS) ×3 IMPLANT
CLIP TI MEDIUM 24 (CLIP) IMPLANT
CLIP TI WIDE RED SMALL 24 (CLIP) IMPLANT
CONN ST 1/4X3/8  BEN (MISCELLANEOUS) ×3
CONN ST 1/4X3/8 BEN (MISCELLANEOUS) ×6 IMPLANT
CONN Y 3/8X3/8X3/8  BEN (MISCELLANEOUS)
CONN Y 3/8X3/8X3/8 BEN (MISCELLANEOUS) IMPLANT
COVER SURGICAL LIGHT HANDLE (MISCELLANEOUS) ×3 IMPLANT
CRADLE DONUT ADULT HEAD (MISCELLANEOUS) ×3 IMPLANT
DRAIN CHANNEL 32F RND 10.7 FF (WOUND CARE) ×3 IMPLANT
DRAPE CARDIOVASCULAR INCISE (DRAPES) ×1
DRAPE INCISE IOBAN 66X45 STRL (DRAPES) ×3 IMPLANT
DRAPE SLUSH/WARMER DISC (DRAPES) ×3 IMPLANT
DRAPE SRG 135X102X78XABS (DRAPES) ×2 IMPLANT
DRSG COVADERM 4X14 (GAUZE/BANDAGES/DRESSINGS) ×3 IMPLANT
ELECT REM PT RETURN 9FT ADLT (ELECTROSURGICAL) ×6
ELECTRODE REM PT RTRN 9FT ADLT (ELECTROSURGICAL) ×4 IMPLANT
GAUZE SPONGE 4X4 12PLY STRL (GAUZE/BANDAGES/DRESSINGS) ×3 IMPLANT
GLOVE BIO SURGEON STRL SZ 6 (GLOVE) ×3 IMPLANT
GLOVE BIO SURGEON STRL SZ 6.5 (GLOVE) IMPLANT
GLOVE BIO SURGEON STRL SZ7 (GLOVE) IMPLANT
GLOVE BIO SURGEON STRL SZ7.5 (GLOVE) IMPLANT
GLOVE BIOGEL PI IND STRL 6 (GLOVE) IMPLANT
GLOVE BIOGEL PI IND STRL 6.5 (GLOVE) IMPLANT
GLOVE BIOGEL PI IND STRL 7.0 (GLOVE) IMPLANT
GLOVE BIOGEL PI INDICATOR 6 (GLOVE)
GLOVE BIOGEL PI INDICATOR 6.5 (GLOVE)
GLOVE BIOGEL PI INDICATOR 7.0 (GLOVE)
GLOVE EUDERMIC 7 POWDERFREE (GLOVE) IMPLANT
GLOVE ORTHO TXT STRL SZ7.5 (GLOVE) ×6 IMPLANT
GOWN STRL REUS W/ TWL LRG LVL3 (GOWN DISPOSABLE) ×12 IMPLANT
GOWN STRL REUS W/TWL LRG LVL3 (GOWN DISPOSABLE) ×6
HEMOSTAT POWDER SURGIFOAM 1G (HEMOSTASIS) ×9 IMPLANT
INSERT FOGARTY 61MM (MISCELLANEOUS) IMPLANT
INSERT FOGARTY XLG (MISCELLANEOUS) ×3 IMPLANT
KIT BASIN OR (CUSTOM PROCEDURE TRAY) ×3 IMPLANT
KIT ROOM TURNOVER OR (KITS) ×3 IMPLANT
KIT SUCTION CATH 14FR (SUCTIONS) ×15 IMPLANT
KIT VASOVIEW W/TROCAR VH 2000 (KITS) ×3 IMPLANT
LEAD PACING MYOCARDI (MISCELLANEOUS) ×3 IMPLANT
MARKER GRAFT CORONARY BYPASS (MISCELLANEOUS) ×9 IMPLANT
NS IRRIG 1000ML POUR BTL (IV SOLUTION) ×15 IMPLANT
PACK OPEN HEART (CUSTOM PROCEDURE TRAY) ×3 IMPLANT
PAD ARMBOARD 7.5X6 YLW CONV (MISCELLANEOUS) ×3 IMPLANT
PAD ELECT DEFIB RADIOL ZOLL (MISCELLANEOUS) ×3 IMPLANT
PENCIL BUTTON HOLSTER BLD 10FT (ELECTRODE) ×3 IMPLANT
PUNCH AORTIC ROTATE 4.0MM (MISCELLANEOUS) IMPLANT
PUNCH AORTIC ROTATE 4.5MM 8IN (MISCELLANEOUS) IMPLANT
PUNCH AORTIC ROTATE 5MM 8IN (MISCELLANEOUS) IMPLANT
SET CARDIOPLEGIA MPS 5001102 (MISCELLANEOUS) ×3 IMPLANT
SOLUTION ANTI FOG 6CC (MISCELLANEOUS) IMPLANT
SPONGE LAP 18X18 X RAY DECT (DISPOSABLE) IMPLANT
SPONGE LAP 4X18 X RAY DECT (DISPOSABLE) ×3 IMPLANT
SUT BONE WAX W31G (SUTURE) ×3 IMPLANT
SUT ETHIBOND X763 2 0 SH 1 (SUTURE) ×6 IMPLANT
SUT MNCRL AB 3-0 PS2 18 (SUTURE) ×6 IMPLANT
SUT MNCRL AB 4-0 PS2 18 (SUTURE) IMPLANT
SUT PDS AB 1 CTX 36 (SUTURE) ×12 IMPLANT
SUT PROLENE 2 0 SH DA (SUTURE) IMPLANT
SUT PROLENE 3 0 SH DA (SUTURE) ×18 IMPLANT
SUT PROLENE 3 0 SH1 36 (SUTURE) IMPLANT
SUT PROLENE 4 0 RB 1 (SUTURE)
SUT PROLENE 4 0 SH DA (SUTURE) ×6 IMPLANT
SUT PROLENE 4-0 RB1 .5 CRCL 36 (SUTURE) IMPLANT
SUT PROLENE 5 0 C 1 36 (SUTURE) IMPLANT
SUT PROLENE 6 0 C 1 30 (SUTURE) ×18 IMPLANT
SUT PROLENE 7.0 RB 3 (SUTURE) ×9 IMPLANT
SUT PROLENE 8 0 BV175 6 (SUTURE) ×12 IMPLANT
SUT PROLENE BLUE 7 0 (SUTURE) ×3 IMPLANT
SUT PROLENE POLY MONO (SUTURE) IMPLANT
SUT SILK  1 MH (SUTURE) ×2
SUT SILK 1 MH (SUTURE) ×4 IMPLANT
SUT STEEL 6MS V (SUTURE) IMPLANT
SUT STEEL STERNAL CCS#1 18IN (SUTURE) IMPLANT
SUT STEEL SZ 6 DBL 3X14 BALL (SUTURE) IMPLANT
SUT VIC AB 1 CTX 36 (SUTURE)
SUT VIC AB 1 CTX36XBRD ANBCTR (SUTURE) IMPLANT
SUT VIC AB 2-0 CT1 27 (SUTURE)
SUT VIC AB 2-0 CT1 TAPERPNT 27 (SUTURE) IMPLANT
SUT VIC AB 2-0 CTX 27 (SUTURE) IMPLANT
SUT VIC AB 3-0 SH 27 (SUTURE)
SUT VIC AB 3-0 SH 27X BRD (SUTURE) IMPLANT
SUT VIC AB 3-0 X1 27 (SUTURE) IMPLANT
SUT VICRYL 4-0 PS2 18IN ABS (SUTURE) IMPLANT
SUTURE E-PAK OPEN HEART (SUTURE) ×3 IMPLANT
SYSTEM SAHARA CHEST DRAIN ATS (WOUND CARE) ×3 IMPLANT
TAPE CLOTH SURG 4X10 WHT LF (GAUZE/BANDAGES/DRESSINGS) ×3 IMPLANT
TAPE PAPER 2X10 WHT MICROPORE (GAUZE/BANDAGES/DRESSINGS) ×3 IMPLANT
TOWEL OR 17X24 6PK STRL BLUE (TOWEL DISPOSABLE) ×6 IMPLANT
TOWEL OR 17X26 10 PK STRL BLUE (TOWEL DISPOSABLE) ×6 IMPLANT
TRAY FOLEY IC TEMP SENS 16FR (CATHETERS) ×3 IMPLANT
TUBING INSUFFLATION (TUBING) ×3 IMPLANT
UNDERPAD 30X30 INCONTINENT (UNDERPADS AND DIAPERS) ×3 IMPLANT
WATER STERILE IRR 1000ML POUR (IV SOLUTION) ×6 IMPLANT

## 2014-03-04 NOTE — Progress Notes (Signed)
TCTS BRIEF SICU PROGRESS NOTE  Day of Surgery  S/P Procedure(s) (LRB): CORONARY ARTERY BYPASS GRAFTING (CABG) (N/A) INTRAOPERATIVE TRANSESOPHAGEAL ECHOCARDIOGRAM (N/A)   Starting to wake on vent NSR - AAI paced w/ stable hemodynamics Chest tube output low UOP excellent Labs okay CXR clear  Plan: Continue routine early postop  Travis Harrison H 03/04/2014 8:07 PM

## 2014-03-04 NOTE — Progress Notes (Signed)
[id number] Cardiac Rehab Completed pre-op education with pt and wife. I gave them surgery education booklet and pt care guide. We discussed sternal precautions, use of IS and walking post-op. He voices understanding. Pt's wife states that she can provide 24/7 care for him at discharge. We will follow pt post-op as ordered.

## 2014-03-04 NOTE — OR Nursing (Signed)
1st call to SICU 

## 2014-03-04 NOTE — Anesthesia Preprocedure Evaluation (Addendum)
Anesthesia Evaluation    Reviewed: Allergy & Precautions, H&P , NPO status , Patient's Chart, lab work & pertinent test results  History of Anesthesia Complications Negative for: history of anesthetic complications  Airway Mallampati: II  TM Distance: >3 FB Neck ROM: Full    Dental  (+) Teeth Intact,    Pulmonary neg shortness of breath, neg sleep apnea, neg COPDneg recent URI, former smoker,  breath sounds clear to auscultation        Cardiovascular hypertension, Pt. on medications + angina + CAD - Past MI and - CHF Rhythm:Regular  L main 90% occluded   Neuro/Psych negative neurological ROS  negative psych ROS   GI/Hepatic negative GI ROS, Neg liver ROS,   Endo/Other  negative endocrine ROS  Renal/GU negative Renal ROS     Musculoskeletal   Abdominal   Peds  Hematology negative hematology ROS (+)   Anesthesia Other Findings   Reproductive/Obstetrics                            Anesthesia Physical Anesthesia Plan  ASA: IV  Anesthesia Plan: General   Post-op Pain Management:    Induction: Intravenous  Airway Management Planned: Oral ETT  Additional Equipment: Arterial line, CVP, PA Cath and 3D TEE  Intra-op Plan:   Post-operative Plan: Post-operative intubation/ventilation  Informed Consent: I have reviewed the patients History and Physical, chart, labs and discussed the procedure including the risks, benefits and alternatives for the proposed anesthesia with the patient or authorized representative who has indicated his/her understanding and acceptance.   Dental advisory given  Plan Discussed with: CRNA and Surgeon  Anesthesia Plan Comments:        Anesthesia Quick Evaluation

## 2014-03-04 NOTE — Transfer of Care (Signed)
Immediate Anesthesia Transfer of Care Note  Patient: Travis Harrison  Procedure(s) Performed: Procedure(s): CORONARY ARTERY BYPASS GRAFTING (CABG) (N/A) INTRAOPERATIVE TRANSESOPHAGEAL ECHOCARDIOGRAM (N/A)  Patient Location: ICU  Anesthesia Type:General  Level of Consciousness: Patient remains intubated per anesthesia plan  Airway & Oxygen Therapy: Patient remains intubated per anesthesia plan  Post-op Assessment: Post -op Vital signs reviewed and stable and report to icu rn  Post vital signs: Reviewed and stable  Complications: No apparent anesthesia complications

## 2014-03-04 NOTE — Op Note (Signed)
CARDIOTHORACIC SURGERY OPERATIVE NOTE  Date of Procedure: 03/04/2014  Preoperative Diagnosis:   Severe Left Main Coronary Artery Disease  Unstable Angina  Postoperative Diagnosis: Same  Procedure:    Coronary Artery Bypass Grafting x 2   Left Internal Mammary Artery to Distal Left Anterior Descending Coronary Artery  Right Internal Mammary Artery to First Obtuse Marginal Branch of Left Circumflex Coronary Artery  Surgeon: Valentina Gu. 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 The patient states that over the past 6 week he has had accelerating symptoms of substernal chest pressure which have developed only with exertion. The first episode was a proximally 5 or 6 weeks ago and developed while the patient was raking leaves. Symptoms subsided within 5-10 minutes of rest. He's had several similar episodes over the past month, and last night at work the patient had 2 episodes while he was lifting some relatively heavy cases of liquor. The second episode was more severe and lasted for nearly 20 minutes before it gradually subsided. With severe chest pressure the patient has also developed numbness radiating to the neck and left arm and mild shortness of breath. He denies any episodes of chest discomfort  occurring at rest. He has not had nocturnal symptoms. In the absence of chest discomfort he has not experienced any recent symptoms of exertional shortness of breath. He denies a history of PND, orthopnea, or lower extremity edema. He presented to [facility] earlier today where he was evaluated by Dr. Radford Pax. The patient was admitted directly to the hospital and underwent cardiac catheterization by Dr. Burt Knack. He was found to have critical 90% stenosis of the proximal left main coronary artery with normal left ventricular systolic function. Cardiothoracic surgical consultation was requested.  The patient has been seen in consultation and counseled at length regarding the indications, risks and potential benefits of surgery.  All questions have been answered, and the patient provides full informed consent for the operation as described.    DETAILS OF THE OPERATIVE PROCEDURE  Preparation:  The patient is brought to the operating room on the above mentioned date and central monitoring was established by the anesthesia team including placement of Swan-Ganz catheter and radial arterial line. The patient is placed in the supine position on the operating table.  Intravenous antibiotics are administered. General endotracheal anesthesia is induced uneventfully. A Foley catheter is placed.  Baseline transesophageal echocardiogram was performed.  Findings were notable for normal LV function.  The aortic valve was tricuspid and functioned normally.  No other significant findings were noted.  The patient's chest, abdomen, both groins, and both lower extremities are prepared and draped in a sterile manner. A time out procedure is performed.   Surgical Approach and Conduit Harvest:  A median sternotomy incision was performed and the left internal mammary artery is dissected from the chest wall and prepared for bypass grafting.  Subsequently the right internal mammary artery is dissected from the chest wall and  prepared for bypass grafting.  Both the left and right internal mammary arteries were notably good quality conduit.  Following systemic heparinization, the left internal mammary artery was transected distally noted to have excellent flow.   Extracorporeal Cardiopulmonary Bypass and Myocardial Protection:  The pericardium is opened. The ascending aorta is mildly dilated but otherwise normal in appearance. The maximum diameter was 4.0-4.2 cm.  The ascending aorta and the right atrium are cannulated for cardioplegia bypass.  Adequate heparinization is verified.     The entire pre-bypass portion of the operation was notable for stable hemodynamics.  Cardiopulmonary bypass was begun and the surface of the heart is inspected. Distal target vessels are selected for coronary artery bypass grafting. A cardioplegia cannula is placed in the ascending aorta.  A temperature probe was placed in the interventricular septum.  The patient is allowed to cool passively to [facility] systemic temperature.  The aortic cross clamp is applied and cold blood cardioplegia is delivered initially in an antegrade fashion through the aortic root.  Iced saline slush is applied for topical hypothermia.  The initial cardioplegic arrest is rapid with early diastolic arrest.  Repeat doses of cardioplegia are administered intermittently throughout the entire cross clamp portion of the operation through the aortic root and through subsequently placed vein grafts in order to maintain completely flat electrocardiogram and septal myocardial temperature below 15C.  Myocardial protection was felt to be excellent.  Coronary Artery Bypass Grafting:   The first obtuse marginal branch of the left circumflex coronary artery was grafted using the right internal mammary artery in an end-to-side fashion .  The right internal mammary artery was utilized in-situ with the pedicle passed posterior to the aorta through the transverse sinus.  At the site of  distal anastomosis the target vessel was good quality and measured approximately 2.0 mm in diameter.  The distal left anterior coronary artery was grafted with the left internal mammary artery in an end-to-side fashion.  At the site of distal anastomosis the target vessel was intramyocardial but good quality and measured approximately 2.0 mm in diameter.  All proximal vein graft anastomoses were placed directly to the ascending aorta prior to removal of the aortic cross clamp.  The septal myocardial temperature rose rapidly after reperfusion of the left internal mammary artery graft.  The aortic cross clamp was removed after a total cross clamp time of 78 minutes.   Procedure Completion:  All proximal and distal coronary anastomoses were inspected for hemostasis and appropriate graft orientation. Epicardial pacing wires are fixed to the right ventricular outflow tract and to the right atrial appendage. The patient is rewarmed to 37C temperature. The patient is weaned and disconnected from cardiopulmonary bypass.  The patient's rhythm at separation from bypass was sinus.  The patient was weaned from cardioplegic bypass without any inotropic support. Total cardiopulmonary bypass time for the operation was 91 minutes.  Followup transesophageal echocardiogram performed after separation from bypass revealed no changes from the preoperative exam.  The aortic and venous cannula were removed uneventfully. Protamine was administered to reverse the anticoagulation. The mediastinum and pleural space were inspected for hemostasis and irrigated with saline solution. The mediastinum and both pleural spaces were drained using 4 chest tubes placed through separate stab incisions inferiorly.  The soft tissues anterior to the aorta were reapproximated loosely. The sternum is closed with double strength sternal wire. The soft tissues anterior to the sternum were  closed in multiple layers and the skin is closed with a  running subcuticular skin closure.  The post-bypass portion of the operation was notable for stable rhythm and hemodynamics.  No blood products were administered during the operation.   Disposition:  The patient tolerated the procedure well and is transported to the surgical intensive care in stable condition. There are no intraoperative complications. All sponge instrument and needle counts are verified correct at completion of the operation.    Valentina Gu. Roxy Manns MD 03/04/2014 6:33 PM

## 2014-03-04 NOTE — Brief Op Note (Addendum)
       [address]       [place] [address]             [phone]     03/03/2014 - 03/04/2014  5:26 PM  PATIENT:  Travis Harrison  63 y.o. 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

## 2014-03-04 NOTE — OR Nursing (Signed)
2nd call to SICU 

## 2014-03-04 NOTE — Plan of Care (Signed)
Problem: Phase I - Pre-Op Goal: Pre-Op Education completed Outcome: Completed/Met Date Met:  03/04/14

## 2014-03-04 NOTE — Anesthesia Procedure Notes (Addendum)
Procedure Name: Intubation Performed by: Garner Nash Pre-anesthesia Checklist: Patient identified, Emergency Drugs available, Suction available, Patient being monitored and Timeout performed Patient Re-evaluated:Patient Re-evaluated prior to inductionOxygen Delivery Method: Circle system utilized Preoxygenation: Pre-oxygenation with 100% oxygen Intubation Type: IV induction Ventilation: Mask ventilation without difficulty Laryngoscope Size: Mac and 4 Grade View: Grade II Tube size: 8.0 mm Number of attempts: 2 Airway Equipment and Method: Stylet and Bougie stylet Placement Confirmation: ETT inserted through vocal cords under direct vision,  positive ETCO2,  CO2 detector and breath sounds checked- equal and bilateral Secured at: 22 cm Tube secured with: Tape Dental Injury: Teeth and Oropharynx as per pre-operative assessment  Difficulty Due To: Difficulty was unanticipated Comments: Anterior airway. DL x 1 with MAC 4- esophageal intubation. Cords in view but unable to advance/pass ETT 2/2 steep angle.ETT removed. DL x 1 with MAC 4.

## 2014-03-04 NOTE — Anesthesia Postprocedure Evaluation (Signed)
  Anesthesia Post-op Note  Patient: Travis Harrison  Procedure(s) Performed: Procedure(s): CORONARY ARTERY BYPASS GRAFTING (CABG) (N/A) INTRAOPERATIVE TRANSESOPHAGEAL ECHOCARDIOGRAM (N/A)  Patient Location: ICU  Anesthesia Type:General  Level of Consciousness: sedated  Airway and Oxygen Therapy: Patient remains intubated per anesthesia plan  Post-op Pain: none  Post-op Assessment: Post-op Vital signs reviewed, Patient's Cardiovascular Status Stable, Respiratory Function Stable, Patent Airway and Pain level controlled  Post-op Vital Signs: Reviewed and stable  Last Vitals:  Filed Vitals:   03/04/14 2130  BP: 97/68  Pulse: 86  Temp: 37 C  Resp: 12    Complications: No apparent anesthesia complications

## 2014-03-04 NOTE — Progress Notes (Signed)
   Primary Cardiologist: Dr. Radford Pax  Subjective/Objective No CP or SOB o/n -- just did not sleep well due to noises  Scheduled Meds: . aminocaproic acid (AMICAR) for OHS   Intravenous To OR  . aspirin  324 mg Oral NOW   Or  . aspirin  300 mg Rectal NOW  . aspirin  81 mg Oral Pre-Cath  . aspirin EC  81 mg Oral Daily  . atorvastatin  40 mg Oral q1800  . bisacodyl  5 mg Oral Once  . cefUROXime (ZINACEF)  IV  1.5 g Intravenous To OR  . cefUROXime (ZINACEF)  IV  750 mg Intravenous To OR  . chlorhexidine  60 mL Topical Once   And  . chlorhexidine  60 mL Topical Once  . dexmedetomidine  0.1-0.7 mcg/kg/hr Intravenous To OR  . DOPamine  0-10 mcg/kg/min Intravenous To OR  . epinephrine  0-10 mcg/min Intravenous To OR  . heparin-papaverine-plasmalyte irrigation   Irrigation To OR  . heparin 30,000 units/NS 1000 mL solution for CELLSAVER   Other To OR  . insulin (NOVOLIN-R) infusion   Intravenous To OR  . magnesium sulfate  40 mEq Other To OR  . metoprolol tartrate  12.5 mg Oral BID  . nitroGLYCERIN  2-200 mcg/min Intravenous To OR  . phenylephrine (NEO-SYNEPHRINE) Adult infusion  30-200 mcg/min Intravenous To OR  . potassium chloride  80 mEq Other To OR  . sodium chloride  3 mL Intravenous Q12H  . sodium chloride  3 mL Intravenous Q12H  . vancomycin 1000 mg in NS (1000 ml) irrigation for Dr. Roxy Manns case   Irrigation To OR  . vancomycin  1,250 mg Intravenous To OR   Continuous Infusions: . nitroGLYCERIN 5 mcg/min (03/03/14 1234)   PRN Meds:sodium chloride, sodium chloride, acetaminophen, nitroGLYCERIN, ondansetron (ZOFRAN) IV, sodium chloride, sodium chloride  Vital signs in last 24 hours: Temp:  [97.5 F (36.4 C)-98.6 F (37 C)] 98.2 F (36.8 C) (11/13 0300) Pulse Rate:  [59-77] 72 (11/13 0615) Resp:  [12-24] 16 (11/13 0400) BP: (113-176)/(62-103) 113/78 mmHg (11/13 0615) SpO2:  [94 %-100 %] 95 % (11/13 0400) Weight:  [143 lb 11.8 oz (65.2 kg)-147 lb 6.4 oz (66.86 kg)] 143 lb  11.8 oz (65.2 kg) (11/12 1156)   Physical Exam General appearance: alert, cooperative, appears stated age, no distress and healthy appearing Neck: no adenopathy, no carotid bruit and no JVD Lungs: clear to auscultation bilaterally and normal percussion bilaterally Heart: regular rate and rhythm, S1, S2 normal, no murmur, click, rub or gallop and normal apical impulse Abdomen: soft, non-tender; bowel sounds normal; no masses,  no organomegaly Extremities: extremities normal, atraumatic, no cyanosis or edema Pulses: 2+ and symmetric Neurologic: Grossly normal  Intake/Output last 3 shifts: I/O last 3 completed shifts: In: 318.1 [I.V.:318.1] Out: 251 [Urine:250; Stool:1] Intake/Output this shift:   EKG - NSR 61, Anterior ST-T changes (TWI) - consider Anterior ischemia, LVH  Problem Assessment/Plan Principal Problem:   Unstable angina pectoris Active Problems:   Left main coronary artery disease   Hyperlipidemia with target LDL less than 70   Essential hypertension  Plan - CABG this AM with Dr. Roxy Manns  On ASA & Statin along with BB.  BP stable - will probably be able to titrate meds post op.  Leonie Man, M.D., M.S. Interventional Cardiologist   Pager # [phone]

## 2014-03-04 NOTE — Procedures (Signed)
Extubation Procedure Note  Patient Details:   Name: Travis Harrison DOB: 03/25/1951 MRN: [id number]   Airway Documentation:  Airway 8 mm (Active)  Secured at (cm) 22 cm 03/04/2014 10:35 PM  Measured From Lips 03/04/2014 10:35 PM  [place] 03/04/2014 10:35 PM  Secured By Rana Snare Tape 03/04/2014 10:35 PM  Site Condition Dry 03/04/2014  8:00 PM    Evaluation  O2 sats: stable throughout Complications: No apparent complications Patient did tolerate procedure well. Bilateral Breath Sounds: Clear, Diminished  Patient performed NIF -25, VC 1.9L prior to extubation with good patient effort.  Patient was alert and oriented, able to stick out tongue and hold head off pillow.  Extubated to 4L [place], sats 98%.  Patient was able to vocalize, RN at bedside.    Lacretia Nicks 03/04/2014, 11:28 PM

## 2014-03-04 NOTE — Progress Notes (Signed)
Result of chest -xray relayed to [place] from [place] to relay to dr. Roxy Manns.

## 2014-03-05 ENCOUNTER — Inpatient Hospital Stay (HOSPITAL_COMMUNITY): Payer: BC Managed Care – PPO

## 2014-03-05 DIAGNOSIS — Z951 Presence of aortocoronary bypass graft: Secondary | ICD-10-CM

## 2014-03-05 LAB — POCT I-STAT 3, ART BLOOD GAS (G3+)
Acid-base deficit: 4 mmol/L — ABNORMAL HIGH (ref 0.0–2.0)
Acid-base deficit: 4 mmol/L — ABNORMAL HIGH (ref 0.0–2.0)
BICARBONATE: 22.1 meq/L (ref 20.0–24.0)
BICARBONATE: 22.6 meq/L (ref 20.0–24.0)
O2 Saturation: 97 %
O2 Saturation: 98 %
PCO2 ART: 46 mmHg — AB (ref 35.0–45.0)
PCO2 ART: 46.2 mmHg — AB (ref 35.0–45.0)
PH ART: 7.289 — AB (ref 7.350–7.450)
PH ART: 7.297 — AB (ref 7.350–7.450)
PO2 ART: 105 mmHg — AB (ref 80.0–100.0)
Patient temperature: 37.1
Patient temperature: 37.1
TCO2: 23 mmol/L (ref 0–100)
TCO2: 24 mmol/L (ref 0–100)
pO2, Arterial: 118 mmHg — ABNORMAL HIGH (ref 80.0–100.0)

## 2014-03-05 LAB — POCT I-STAT, CHEM 8
BUN: 8 mg/dL (ref 6–23)
CALCIUM ION: 1.2 mmol/L (ref 1.13–1.30)
Chloride: 104 mEq/L (ref 96–112)
Creatinine, Ser: 0.6 mg/dL (ref 0.50–1.35)
Glucose, Bld: 118 mg/dL — ABNORMAL HIGH (ref 70–99)
HCT: 34 % — ABNORMAL LOW (ref 39.0–52.0)
HEMOGLOBIN: 11.6 g/dL — AB (ref 13.0–17.0)
Potassium: 4.4 mEq/L (ref 3.7–5.3)
Sodium: 136 mEq/L — ABNORMAL LOW (ref 137–147)
TCO2: 22 mmol/L (ref 0–100)

## 2014-03-05 LAB — MAGNESIUM
MAGNESIUM: 2.6 mg/dL — AB (ref 1.5–2.5)
Magnesium: 2.8 mg/dL — ABNORMAL HIGH (ref 1.5–2.5)

## 2014-03-05 LAB — CBC
HCT: 32.3 % — ABNORMAL LOW (ref 39.0–52.0)
HCT: 35.5 % — ABNORMAL LOW (ref 39.0–52.0)
Hemoglobin: 10.9 g/dL — ABNORMAL LOW (ref 13.0–17.0)
Hemoglobin: 11.7 g/dL — ABNORMAL LOW (ref 13.0–17.0)
MCH: 31.1 pg (ref 26.0–34.0)
MCH: 31 pg (ref 26.0–34.0)
MCHC: 33.7 g/dL (ref 30.0–36.0)
MCHC: 33 g/dL (ref 30.0–36.0)
MCV: 92.3 fL (ref 78.0–100.0)
MCV: 93.9 fL (ref 78.0–100.0)
Platelets: 155 10*3/uL (ref 150–400)
Platelets: 171 10*3/uL (ref 150–400)
RBC: 3.5 MIL/uL — AB (ref 4.22–5.81)
RBC: 3.78 MIL/uL — AB (ref 4.22–5.81)
RDW: 12.9 % (ref 11.5–15.5)
RDW: 13.3 % (ref 11.5–15.5)
WBC: 9.6 10*3/uL (ref 4.0–10.5)
WBC: 11 10*3/uL — AB (ref 4.0–10.5)

## 2014-03-05 LAB — BASIC METABOLIC PANEL
Anion gap: 9 (ref 5–15)
BUN: 8 mg/dL (ref 6–23)
CALCIUM: 7.8 mg/dL — AB (ref 8.4–10.5)
CO2: 22 meq/L (ref 19–32)
CREATININE: 0.64 mg/dL (ref 0.50–1.35)
Chloride: 106 mEq/L (ref 96–112)
GFR calc Af Amer: >90 mL/min (ref >90)
GFR calc non Af Amer: >90 mL/min (ref >90)
Glucose, Bld: 103 mg/dL — ABNORMAL HIGH (ref 70–99)
Potassium: 4.4 mEq/L (ref 3.7–5.3)
Sodium: 137 mEq/L (ref 137–147)

## 2014-03-05 LAB — GLUCOSE, CAPILLARY
GLUCOSE-CAPILLARY: 107 mg/dL — AB (ref 70–99)
GLUCOSE-CAPILLARY: 105 mg/dL — AB (ref 70–99)
GLUCOSE-CAPILLARY: 141 mg/dL — AB (ref 70–99)
GLUCOSE-CAPILLARY: 121 mg/dL — AB (ref 70–99)
Glucose-Capillary: 115 mg/dL — ABNORMAL HIGH (ref 70–99)
Glucose-Capillary: 106 mg/dL — ABNORMAL HIGH (ref 70–99)
Glucose-Capillary: 119 mg/dL — ABNORMAL HIGH (ref 70–99)
Glucose-Capillary: 65 mg/dL — ABNORMAL LOW (ref 70–99)
Glucose-Capillary: 111 mg/dL — ABNORMAL HIGH (ref 70–99)
Glucose-Capillary: 111 mg/dL — ABNORMAL HIGH (ref 70–99)
Glucose-Capillary: 118 mg/dL — ABNORMAL HIGH (ref 70–99)
Glucose-Capillary: 125 mg/dL — ABNORMAL HIGH (ref 70–99)

## 2014-03-05 LAB — CREATININE, SERUM
Creatinine, Ser: 0.61 mg/dL (ref 0.50–1.35)
GFR calc Af Amer: >90 mL/min (ref >90)

## 2014-03-05 MED ORDER — FUROSEMIDE 10 MG/ML IJ SOLN
20.0000 mg | Freq: Once | INTRAMUSCULAR | Status: AC
Start: 2014-03-05 — End: 2014-03-05
  Administered 2014-03-05: 20 mg via INTRAVENOUS
  Filled 2014-03-05: qty 2

## 2014-03-05 MED ORDER — DEXTROSE 50 % IV SOLN
12.0000 mL | Freq: Once | INTRAVENOUS | Status: AC
  Administered 2014-03-05: 12 mL via INTRAVENOUS

## 2014-03-05 MED ORDER — MORPHINE SULFATE 2 MG/ML IJ SOLN
2.0000 mg | INTRAMUSCULAR | Status: DC | PRN
  Administered 2014-03-05 – 2014-03-06 (×3): 2 mg via INTRAVENOUS
  Filled 2014-03-05 (×3): qty 1

## 2014-03-05 MED ORDER — MORPHINE SULFATE 2 MG/ML IJ SOLN
INTRAMUSCULAR | Status: AC
  Filled 2014-03-05: qty 1

## 2014-03-05 MED ORDER — DEXTROSE 50 % IV SOLN
INTRAVENOUS | Status: AC
  Filled 2014-03-05: qty 50

## 2014-03-05 MED ORDER — KETOROLAC TROMETHAMINE 15 MG/ML IJ SOLN
15.0000 mg | Freq: Four times a day (QID) | INTRAMUSCULAR | Status: AC
  Administered 2014-03-05 – 2014-03-06 (×5): 15 mg via INTRAVENOUS
  Filled 2014-03-05 (×6): qty 1

## 2014-03-05 MED ORDER — CETYLPYRIDINIUM CHLORIDE 0.05 % MT LIQD
7.0000 mL | Freq: Two times a day (BID) | OROMUCOSAL | Status: DC
  Administered 2014-03-05 – 2014-03-08 (×6): 7 mL via OROMUCOSAL

## 2014-03-05 MED ORDER — INSULIN ASPART 100 UNIT/ML ~~LOC~~ SOLN
0.0000 [IU] | SUBCUTANEOUS | Status: DC
  Administered 2014-03-05: 2 [IU] via SUBCUTANEOUS

## 2014-03-05 MED ORDER — METOCLOPRAMIDE HCL 5 MG/ML IJ SOLN
10.0000 mg | Freq: Four times a day (QID) | INTRAMUSCULAR | Status: DC
  Administered 2014-03-06 – 2014-03-07 (×5): 10 mg via INTRAVENOUS
  Filled 2014-03-05 (×15): qty 2

## 2014-03-05 MED ORDER — SODIUM CHLORIDE 0.9 % IV SOLN: INTRAVENOUS | Status: DC | PRN

## 2014-03-05 MED ORDER — INSULIN ASPART 100 UNIT/ML ~~LOC~~ SOLN
0.0000 [IU] | SUBCUTANEOUS | Status: DC
  Administered 2014-03-06: 2 [IU] via SUBCUTANEOUS

## 2014-03-05 NOTE — Progress Notes (Signed)
Dr. Roxy Manns called with ABG results and patient status

## 2014-03-05 NOTE — Progress Notes (Signed)
TCTS BRIEF SICU PROGRESS NOTE  1 Day Post-Op  S/P Procedure(s) (LRB): CORONARY ARTERY BYPASS GRAFTING (CABG) (N/A) INTRAOPERATIVE TRANSESOPHAGEAL ECHOCARDIOGRAM (N/A)   Stable day NSR w/ stable BP O2 sats 97% on 2 L/min UOP adequate Labs okay  Plan: Continue current plan  Rexene Alberts 03/05/2014 7:57 PM

## 2014-03-05 NOTE — Progress Notes (Signed)
Patient became extremely nauseated, SBP dropped in the 50s along with a HR in the 50s, Patient was immediately paced at AAI @ 80, HOB elevated, patient felt better and pacer was switched to pace AAI @ 70 to keep BP stable. Will monitor, nausea did get better.

## 2014-03-05 NOTE — Progress Notes (Signed)
      [address]       [place] [address]             [phone]        CARDIOTHORACIC SURGERY PROGRESS NOTE   R1 Day Post-Op Procedure(s) (LRB): CORONARY ARTERY BYPASS GRAFTING (CABG) (N/A) INTRAOPERATIVE TRANSESOPHAGEAL ECHOCARDIOGRAM (N/A)  Subjective: Looks good.  Mild soreness in chest.  Some nausea earlier.  Objective: Vital signs: BP Readings from Last 1 Encounters:  03/05/14 115/71   Pulse Readings from Last 1 Encounters:  03/05/14 75   Resp Readings from Last 1 Encounters:  03/05/14 18   Temp Readings from Last 1 Encounters:  03/05/14 98.1 F (36.7 C) Core (Comment)    Hemodynamics: PAP: (19-33)/(7-24) 33/18 mmHg CO:  [3.2 L/min-4.8 L/min] 4.1 L/min CI:  [1.8 L/min/m2-10.2 L/min/m2] 2.3 L/min/m2  Physical Exam:  Rhythm:   sinus  Breath sounds: clear  Heart sounds:  RRR  Incisions:  Dressings dry, intact  Abdomen:  Soft, non-distended, non-tender  Extremities:  Warm, well-perfused    Intake/Output from previous day: 11/13 0701 - 11/14 0700 In: 5818.1 [P.O.:340; I.V.:4096.1; Blood:382; IV Piggyback:1000] Out: 3382 [Urine:1885; Blood:1200; Chest Tube:740] Intake/Output this shift: Total I/O In: 90 [I.V.:90] Out: 285 [Urine:145; Chest Tube:140]  Lab Results:  CBC: Recent Labs  03/04/14 1920 03/04/14 1927 03/05/14 0500  WBC 13.1*  --  9.6  HGB 11.4* 11.2* 10.9*  HCT 33.2* 33.0* 32.3*  PLT 189  --  155    BMET:  Recent Labs  03/04/14 0120  03/04/14 1751 03/04/14 1927 03/05/14 0500  NA 140  < > 136* 139 137  K 4.4  < > 3.8 3.4* 4.4  CL 101  < > 99  --  106  CO2 26  --   --   --  22  GLUCOSE 90  < > 124* 98 103*  BUN 9  < > 5*  --  8  CREATININE 0.70  < > 0.60  --  0.64  CALCIUM 9.2  --   --   --  7.8*  < > = values in this interval not displayed.   CBG (last 3)   Recent Labs  03/05/14 0440 03/05/14 0631 03/05/14 0659  GLUCAP 106* 65* 119*    ABG    Component Value Date/Time   PHART 7.297*  03/05/2014 0020   PCO2ART 46.2* 03/05/2014 0020   PO2ART 105.0* 03/05/2014 0020   HCO3 22.6 03/05/2014 0020   TCO2 24 03/05/2014 0020   ACIDBASEDEF 4.0* 03/05/2014 0020   O2SAT 97.0 03/05/2014 0020    CXR: PORTABLE CHEST - 1 VIEW  COMPARISON: 03/04/2014  FINDINGS: Endotracheal tube and nasogastric catheter been removed in the interval. Bilateral thoracostomy catheters mediastinal drain, pericardial drain and Swan-Ganz catheter remain. Minimal bibasilar atelectatic changes are noted. No acute bony abnormality is seen. Median sternotomy and again noted.  IMPRESSION: Mild bibasilar atelectatic changes.   Electronically Signed  By: Inez Catalina M.D.  On: 03/05/2014 08:15    EKG: NSR w/out acute ischemic changes    Assessment/Plan: S/P Procedure(s) (LRB): CORONARY ARTERY BYPASS GRAFTING (CABG) (N/A) INTRAOPERATIVE TRANSESOPHAGEAL ECHOCARDIOGRAM (N/A)  Doing well POD1 Maintaining NSR w/ stable hemodynamics off all drips Expected post op acute blood loss anemia, mild, stable Expected post op atelectasis, mild Expected post op volume excess, mild   Mobilize  Diuresis  Leave chest tubes in for now  Routine postop   OWEN,CLARENCE H 03/05/2014 10:38 AM

## 2014-03-05 NOTE — Progress Notes (Signed)
Hypoglycemic Event  CBG: 65  Treatment: D50 68ml per glucostabilizer  Symptoms: None  Follow-up CBG: [id number] CBG Result:119  Possible Reasons for Event: Unknown  Comments/MD notified:    Shenita Trego, Carolynn Comment  Remember to initiate Hypoglycemia Order Set & complete

## 2014-03-06 ENCOUNTER — Inpatient Hospital Stay (HOSPITAL_COMMUNITY): Payer: BC Managed Care – PPO

## 2014-03-06 LAB — CBC
HCT: 32.4 % — ABNORMAL LOW (ref 39.0–52.0)
Hemoglobin: 10.5 g/dL — ABNORMAL LOW (ref 13.0–17.0)
MCH: 30.6 pg (ref 26.0–34.0)
MCHC: 32.4 g/dL (ref 30.0–36.0)
MCV: 94.5 fL (ref 78.0–100.0)
PLATELETS: 144 10*3/uL — AB (ref 150–400)
RBC: 3.43 MIL/uL — ABNORMAL LOW (ref 4.22–5.81)
RDW: 13.1 % (ref 11.5–15.5)
WBC: 8.6 10*3/uL (ref 4.0–10.5)

## 2014-03-06 LAB — BASIC METABOLIC PANEL
Anion gap: 8 (ref 5–15)
BUN: 14 mg/dL (ref 6–23)
CO2: 27 mEq/L (ref 19–32)
Calcium: 8.4 mg/dL (ref 8.4–10.5)
Chloride: 100 mEq/L (ref 96–112)
Creatinine, Ser: 0.75 mg/dL (ref 0.50–1.35)
GFR calc non Af Amer: >90 mL/min (ref >90)
GLUCOSE: 94 mg/dL (ref 70–99)
Potassium: 4.1 mEq/L (ref 3.7–5.3)
Sodium: 135 mEq/L — ABNORMAL LOW (ref 137–147)

## 2014-03-06 LAB — GLUCOSE, CAPILLARY
GLUCOSE-CAPILLARY: 116 mg/dL — AB (ref 70–99)
Glucose-Capillary: 105 mg/dL — ABNORMAL HIGH (ref 70–99)
Glucose-Capillary: 123 mg/dL — ABNORMAL HIGH (ref 70–99)
Glucose-Capillary: 157 mg/dL — ABNORMAL HIGH (ref 70–99)
Glucose-Capillary: 97 mg/dL (ref 70–99)

## 2014-03-06 MED ORDER — FUROSEMIDE 40 MG PO TABS
40.0000 mg | ORAL_TABLET | Freq: Every day | ORAL | Status: AC
  Administered 2014-03-06 – 2014-03-08 (×3): 40 mg via ORAL
  Filled 2014-03-06 (×2): qty 1

## 2014-03-06 MED ORDER — ATORVASTATIN CALCIUM 10 MG PO TABS
10.0000 mg | ORAL_TABLET | Freq: Every day | ORAL | Status: DC
  Administered 2014-03-06 – 2014-03-08 (×3): 10 mg via ORAL
  Filled 2014-03-06 (×3): qty 1

## 2014-03-06 MED ORDER — ASPIRIN EC 325 MG PO TBEC: 325.0000 mg | DELAYED_RELEASE_TABLET | Freq: Once | ORAL | Status: DC

## 2014-03-06 MED ORDER — SODIUM CHLORIDE 0.9 % IV SOLN: 250.0000 mL | INTRAVENOUS | Status: DC | PRN

## 2014-03-06 MED ORDER — POTASSIUM CHLORIDE CRYS ER 20 MEQ PO TBCR
20.0000 meq | EXTENDED_RELEASE_TABLET | Freq: Every day | ORAL | Status: AC
  Administered 2014-03-07 – 2014-03-08 (×2): 20 meq via ORAL
  Filled 2014-03-06 (×3): qty 1

## 2014-03-06 MED ORDER — MOVING RIGHT ALONG BOOK
Freq: Once | Status: AC
  Administered 2014-03-06: 12:00:00
  Filled 2014-03-06: qty 1

## 2014-03-06 MED ORDER — BUPROPION HCL ER (SR) 100 MG PO TB12
100.0000 mg | ORAL_TABLET | Freq: Every day | ORAL | Status: DC
  Administered 2014-03-06 – 2014-03-08 (×3): 100 mg via ORAL
  Filled 2014-03-06 (×3): qty 1

## 2014-03-06 MED ORDER — MIDAZOLAM HCL 2 MG/2ML IJ SOLN
2.0000 mg | Freq: Once | INTRAMUSCULAR | Status: AC
  Administered 2014-03-06: 1 mg via INTRAVENOUS
  Filled 2014-03-06: qty 2

## 2014-03-06 MED ORDER — SODIUM CHLORIDE 0.9 % IJ SOLN: 3.0000 mL | INTRAMUSCULAR | Status: DC | PRN

## 2014-03-06 MED ORDER — SODIUM CHLORIDE 0.9 % IJ SOLN
3.0000 mL | Freq: Two times a day (BID) | INTRAMUSCULAR | Status: DC
  Administered 2014-03-06 – 2014-03-07 (×4): 3 mL via INTRAVENOUS

## 2014-03-06 NOTE — Plan of Care (Signed)
Problem: Phase I - Pre-Op Goal: Point person for discharge identified Outcome: Completed/Met Date Met:  03/06/14 Goal: Pain controlled with appropriate interventions Outcome: Completed/Met Date Met:  03/06/14 Goal: Pre-Op Consults completed as appropriate Outcome: Completed/Met Date Met:  03/06/14 Goal: Treatment completed per MD order Outcome: Completed/Met Date Met:  03/06/14 Goal: Patient progressed to Phase II; barriers addressed Outcome: Completed/Met Date Met:  03/06/14  Problem: Phase II - Intermediate Post-Op Goal: Wean to Extubate Outcome: Completed/Met Date Met:  03/06/14 Goal: Maintain Hemodynamic Stability Outcome: Completed/Met Date Met:  03/06/14 Goal: CBGs/Blood Glucose per SCIP Criteria Outcome: Completed/Met Date Met:  03/06/14 Goal: Pain controlled with appropriate interventions Outcome: Completed/Met Date Met:  03/06/14 Goal: Advance Diet Outcome: Progressing Pt had some nausea, tolerated clear liquids well but did not want advance Goal: Activity Progressed Outcome: Completed/Met Date Met:  03/06/14

## 2014-03-06 NOTE — Progress Notes (Signed)
MT and PT Chest tubes removed at 1245, patient was given 1mg  IV Versed prior to removal and 2mg  IV morphine after removal, patient was flat, instructed to hold his breath, and tolerated removal well, VS stable. Vaseline gauze pressure dressing applied.  [place] removed, patient instructed to hold breath, pressure was held, occlusive pressure dressing applied, patient was on bedrest til 1345  Foley removed at 1415.

## 2014-03-06 NOTE — Progress Notes (Signed)
      [address]       ,[place] [address]             [phone]        CARDIOTHORACIC SURGERY PROGRESS NOTE   R2 Days Post-Op Procedure(s) (LRB): CORONARY ARTERY BYPASS GRAFTING (CABG) (N/A) INTRAOPERATIVE TRANSESOPHAGEAL ECHOCARDIOGRAM (N/A)  Subjective: Doing well.  Soreness in chest.  Otherwise doing well.  Objective: Vital signs: BP Readings from Last 1 Encounters:  03/06/14 110/63   Pulse Readings from Last 1 Encounters:  03/06/14 70   Resp Readings from Last 1 Encounters:  03/06/14 14   Temp Readings from Last 1 Encounters:  03/06/14 98.1 F (36.7 C) Oral    Hemodynamics:    Physical Exam:  Rhythm:   sinus  Breath sounds: clear  Heart sounds:  RRR  Incisions:  Clean and dry  Abdomen:  Soft, non-distended, non-tender  Extremities:  Warm, well-perfused    Intake/Output from previous day: 11/14 0701 - 11/15 0700 In: 296 [P.O.:100; I.V.:96; IV Piggyback:100] Out: 1865 [Urine:1180; Chest Tube:685] Intake/Output this shift: Total I/O In: 170 [P.O.:120; IV Piggyback:50] Out: 270 [Urine:90; Chest Tube:180]  Lab Results:  CBC: Recent Labs  03/05/14 1620 03/05/14 1635 03/06/14 0400  WBC 11.0*  --  8.6  HGB 11.7* 11.6* 10.5*  HCT 35.5* 34.0* 32.4*  PLT 171  --  144*    BMET:  Recent Labs  03/05/14 0500  03/05/14 1635 03/06/14 0400  NA 137  --  136* 135*  K 4.4  --  4.4 4.1  CL 106  --  104 100  CO2 22  --   --  27  GLUCOSE 103*  --  118* 94  BUN 8  --  8 14  CREATININE 0.64  < > 0.60 0.75  CALCIUM 7.8*  --   --  8.4  < > = values in this interval not displayed.   CBG (last 3)   Recent Labs  03/06/14 03/06/14 0355 03/06/14 0914  GLUCAP 123* 97 157*    ABG    Component Value Date/Time   PHART 7.297* 03/05/2014 0020   PCO2ART 46.2* 03/05/2014 0020   PO2ART 105.0* 03/05/2014 0020   HCO3 22.6 03/05/2014 0020   TCO2 22 03/05/2014 1635   ACIDBASEDEF 4.0* 03/05/2014 0020   O2SAT 97.0 03/05/2014 0020     CXR:  PORTABLE CHEST - 1 VIEW  COMPARISON: 03/05/2014  FINDINGS: Cardiomediastinal silhouette is stable. Status post CABG. Stable bilateral chest tubes position. Right Swan-Ganz catheter has been removed. Mediastinal drain is unchanged in position. Right IJ sheath in place. No pneumothorax. Small left pleural effusion left basilar atelectasis or infiltrate. No pulmonary edema.  IMPRESSION: Stable bilateral chest tube position. Right Swan-Ganz catheter has been removed. Small left pleural effusion left basilar atelectasis or infiltrate. No pulmonary edema.   Electronically Signed  By: Lahoma Crocker M.D.  On: 03/06/2014 10:31        Assessment/Plan: S/P Procedure(s) (LRB): CORONARY ARTERY BYPASS GRAFTING (CABG) (N/A) INTRAOPERATIVE TRANSESOPHAGEAL ECHOCARDIOGRAM (N/A)  Doing well POD2 Maintaining NSR w/ stable BP Expected post op acute blood loss anemia, mild, stable Expected post op atelectasis, mild Expected post op volume excess, mild   Mobilize  Diuresis  D/C chest tubes  Transfer floor   OWEN,CLARENCE H 03/06/2014 11:42 AM

## 2014-03-06 NOTE — Progress Notes (Signed)
First call to 2W

## 2014-03-06 NOTE — Progress Notes (Signed)
Patient tx to 2W, ambulated entire way, patient tolerated well, VSS.

## 2014-03-07 ENCOUNTER — Inpatient Hospital Stay (HOSPITAL_COMMUNITY): Payer: BC Managed Care – PPO

## 2014-03-07 ENCOUNTER — Encounter (HOSPITAL_COMMUNITY): Payer: Self-pay | Admitting: Thoracic Surgery (Cardiothoracic Vascular Surgery)

## 2014-03-07 LAB — CBC
HCT: 29.5 % — ABNORMAL LOW (ref 39.0–52.0)
Hemoglobin: 9.8 g/dL — ABNORMAL LOW (ref 13.0–17.0)
MCH: 30.6 pg (ref 26.0–34.0)
MCHC: 33.2 g/dL (ref 30.0–36.0)
MCV: 92.2 fL (ref 78.0–100.0)
Platelets: 143 10*3/uL — ABNORMAL LOW (ref 150–400)
RBC: 3.2 MIL/uL — AB (ref 4.22–5.81)
RDW: 12.7 % (ref 11.5–15.5)
WBC: 6.3 10*3/uL (ref 4.0–10.5)

## 2014-03-07 LAB — BASIC METABOLIC PANEL
Anion gap: 10 (ref 5–15)
BUN: 13 mg/dL (ref 6–23)
CHLORIDE: 98 meq/L (ref 96–112)
CO2: 29 meq/L (ref 19–32)
Calcium: 8.1 mg/dL — ABNORMAL LOW (ref 8.4–10.5)
Creatinine, Ser: 0.68 mg/dL (ref 0.50–1.35)
GFR calc Af Amer: >90 mL/min (ref >90)
GFR calc non Af Amer: >90 mL/min (ref >90)
Glucose, Bld: 101 mg/dL — ABNORMAL HIGH (ref 70–99)
Potassium: 4.1 mEq/L (ref 3.7–5.3)
SODIUM: 137 meq/L (ref 137–147)

## 2014-03-07 MED ORDER — ASPIRIN EC 325 MG PO TBEC
325.0000 mg | DELAYED_RELEASE_TABLET | Freq: Every day | ORAL | Status: DC
  Administered 2014-03-07 – 2014-03-08 (×2): 325 mg via ORAL
  Filled 2014-03-07 (×2): qty 1

## 2014-03-07 MED ORDER — LISINOPRIL 5 MG PO TABS
5.0000 mg | ORAL_TABLET | Freq: Every day | ORAL | Status: DC
  Administered 2014-03-07: 5 mg via ORAL
  Filled 2014-03-07 (×2): qty 1

## 2014-03-07 MED ORDER — LISINOPRIL 2.5 MG PO TABS
2.5000 mg | ORAL_TABLET | Freq: Every day | ORAL | Status: DC
  Administered 2014-03-07: 2.5 mg via ORAL
  Filled 2014-03-07: qty 1

## 2014-03-07 MED FILL — Heparin Sodium (Porcine) Inj 1000 Unit/ML: INTRAMUSCULAR | Qty: 30 | Status: AC

## 2014-03-07 MED FILL — Magnesium Sulfate Inj 50%: INTRAMUSCULAR | Qty: 10 | Status: AC

## 2014-03-07 MED FILL — Potassium Chloride Inj 2 mEq/ML: INTRAVENOUS | Qty: 40 | Status: AC

## 2014-03-07 NOTE — Discharge Summary (Signed)
Physician Discharge Summary       [address]       [place] [address]             [phone]    Patient ID: EMRIK ERHARD MRN: [id number] DOB/AGE: 08-27-1950 63 y.o.  Admit date: 03/03/2014 Discharge date: 03/08/2014  Admission Diagnoses: 1. Coronary artery disease (critical left main disease) 2. History of hypertension 3. History of hyperlipidemia 4. History of tobacco abuse  Discharge Diagnoses:  1. Coronary artery disease (critical left main disease) 2. History of hypertension 3. History of hyperlipidemia 4. History of tobacco abuse 5. Mild thrombocytopenia 6. ABL anemia  Procedure (s): Cardiac Catheterization done by Dr. Radford Pax on 03/03/2014:  Coronary angiography: Coronary dominance: right  Left mainstem: 90% proximal stenosis   Left anterior descending (LAD): Patent to the LV apex. There is mild diffuse mid-vessel stenosis of 30-40%. The first diagonal is large with mild ostial stenosis.  Left circumflex (LCx): The circumflex is medium in caliber. There is diffuse disease through the mid-vessel of no more than 50%. The first OM is large in caliber and distribution.  Right coronary artery (RCA): Dominant vessel. There are diffuse irregularities with 30% distal stenosis. The PDA has 50% stenosis. The PLA is patent.   Left ventriculography: Left ventricular systolic function is normal, LVEF is estimated at 65%, there is no significant mitral regurgitation   Estimated Blood Loss: minimal  Final Conclusions:  1. Severe left main stenosis 2. Nonobstructive LAD/LCx/RCA stenosis 3. Normal/vigorous LV function    Coronary Artery Bypass Grafting x 2 Left Internal Mammary Artery to Distal Left Anterior Descending Coronary Artery Right Internal Mammary Artery to First Obtuse Marginal Branch of Left Circumflex Coronary Artery by Dr. Roxy Manns on 03/04/2014.    History of Presenting Illness: Patient is a 63 year old  white male with no previous history of coronary artery disease but was factors notable for history of hypertension, hyperlipidemia, remote tobacco use, and a strong family history of coronary artery disease who presents with a one-month history of classical symptoms of angina pectoris. The patient states that over the past 6 week he has had accelerating symptoms of substernal chest pressure which have developed only with exertion. The first episode was a proximally 5 or 6 weeks ago and developed while the patient was raking leaves. Symptoms subsided within 5-10 minutes of rest. He's had several similar episodes over the past month, and last night at work the patient had 2 episodes while he was lifting some relatively heavy cases of liquor. The second episode was more severe and lasted for nearly 20 minutes before it gradually subsided. With severe chest pressure the patient has also developed numbness radiating to the neck and left arm and mild shortness of breath. He denies any episodes of chest discomfort occurring at rest. He has not had nocturnal symptoms. In the absence of chest discomfort he has not experienced any recent symptoms of exertional shortness of breath. He denies a history of PND, orthopnea, or lower extremity edema. He presented to [facility] earlier today where he was evaluated by Dr. Radford Pax. The patient was admitted directly to the hospital and underwent cardiac catheterization by Dr. Burt Knack. He was found to have critical 90% stenosis of the proximal left main coronary artery with normal left ventricular systolic function. Cardiothoracic surgical consultation was requested.  The patient is married and lives locally in [place] with his wife. He works full-time for the [employer]. He has remained physically active and  otherwise healthy all of his life. He walks nearly every day, typically 2 miles at a time. He reports no significant physical limitations. Dr. Roxy Manns  discussed the need for coronary artery bypass grafting surgery. Potential risks, benefits, and complications were discussed with the patient and he agreed to proceed with surgery. Pre operative carotid duplex US showed a right internal carotid stenosis 40-59% and no significant left internal carotid artery stenosis. He underwent a CABG x 2 on 03/04/2014.    Brief Hospital Course: The patient was extubated late the evening of surgery without difficulty. He remained afebrile and hemodynamically stable. Gordy Councilman, a line, and foley were removed early in the post operative course. Chest tubes remained for a few days post op. Lopressor was started and titrated accordingly. He was volume overloaded and diuresed. He did have ABL anemia. He did not require a post op transfusion. His last H and H was 9.8 and 29.5. He also had mild thrombocytopenia. His last platelet count was stable at 143,000.He was weaned off the insulin drip. The patient's HGA1C pre op was 5.5. The patient was felt surgically stable for transfer from the ICU to [facility] for further convalescence on 03/06/2013. He  continues to progress with cardiac rehab. He was ambulating on room air. He has been tolerating a diet and has had a bowel movement. Epicardial pacing wires and chest tube sutures will be removed prior to discharge. Provided the patient remains afebrile, hemodynamically stable, and pending morning round evaluation, he will be surgically stable for discharge on 03/08/2013.   Latest Vital Signs: Blood pressure 155/77, pulse 78, temperature 98.7 F (37.1 C), temperature source Oral, resp. rate 18, height 5\' 8"  (1.727 m), weight 148 lb 5.9 oz (67.3 kg), SpO2 92 %.  Physical Exam: Cardiovascular: RRR Pulmonary: Clear to auscultation bilaterally; no rales, wheezes, or rhonchi. Abdomen: Soft, non tender, bowel sounds present. Extremities: Trace lower extremity edema. Wounds: Sternal dressing removed and wound is clean and dry. No  erythema or signs of infection.   Discharge Condition: Stable  Recent laboratory studies:  Lab Results  Component Value Date   WBC 6.3 03/07/2014   HGB 9.8* 03/07/2014   HCT 29.5* 03/07/2014   MCV 92.2 03/07/2014   PLT 143* 03/07/2014   Lab Results  Component Value Date   NA 137 03/07/2014   K 4.1 03/07/2014   CL 98 03/07/2014   CO2 29 03/07/2014   CREATININE 0.68 03/07/2014   GLUCOSE 101* 03/07/2014      Diagnostic Studies: Dg Chest 2 View  03/07/2014   CLINICAL DATA:  Post CABG 03/04/2014.  Atelectasis.  EXAM: CHEST  2 VIEW  COMPARISON:  03/06/2014.  FINDINGS: Trachea is midline. Right IJ catheter sheath, mediastinal drain and chest tubes have been removed. Epicardial pacer wires remain in place. 8 intact sternotomy wires, unchanged. Cardiomediastinal silhouette is within normal limits for size and contour. Biapical pleural thickening, right greater than left. Small bilateral effusions with minimal bibasilar atelectasis, left greater than right. No pneumothorax.  IMPRESSION: 1. Small bilateral effusions with mild bibasilar atelectasis, left greater than right. 2. No pneumothorax.   Electronically Signed   By: Lorin Picket M.D.   On: 03/07/2014 08:26   Ct Chest Wo Contrast  03/04/2014   CLINICAL DATA:  Questionable nodule on recent chest radiograph  EXAM: CT CHEST WITHOUT CONTRAST  TECHNIQUE: Multidetector CT imaging of the chest was performed following the standard protocol without intravenous contrast material administration.  COMPARISON:  Chest radiograph obtained earlier in the  day  FINDINGS: There is underlying centrilobular emphysematous type change. There is mild scarring in each lung base. There is no parenchymal lung mass lesion. Along the right heart border, there is mild epicardial fat prominence as well as some mild prominence of the right atrium laterally. A well-defined mass in this area is not seen on this noncontrast enhanced CT in the right cardiophrenic angle.   There is prominence of the ascending thoracic aorta with a maximum transverse diameter of 4.6 x 4.2 cm. The diameter of the descending thoracic aorta in the mid thoracic region measures 3.0 x 2.7 cm. There is no periaortic fluid. No obvious dissection is seen on this noncontrast enhanced study. No pulmonary embolus is seen on this noncontrast enhanced study.  There is no appreciable thoracic adenopathy. The pericardium is not thickened. There are scattered foci of coronary artery calcification.  In the visualized upper abdomen, there is atherosclerotic change in the aorta. There is questionable sludge in the gallbladder.  There is degenerative change in the thoracic spine. There are no blastic or lytic bone lesions. Thyroid appears unremarkable.  IMPRESSION: No parenchymal lung mass lesions are identified. There is underlying centrilobular emphysematous type change. There is mild scarring in the lung bases.  In the right cardiophrenic angle region, a well-defined mass is not seen. There appears to be some mild prominence along the lateral right atrium which is felt to correspond to the area of increased opacity seen on the chest radiograph in this area. Note that without intravenous contrast, this area is somewhat difficult to evaluate and entirely exclude a mass arising from the lateral right atrium. If there is concern for such potential abnormality, echocardiography would be the imaging study of choice to further assess the right atrium.  There is dilatation of the ascending thoracic aorta.  There are scattered foci of coronary artery calcification.  No adenopathy.   Electronically Signed   By: Lowella Grip M.D.   On: 03/04/2014 11:21   Discharge Medications:   Medication List    STOP taking these medications        VIAGRA 100 MG tablet  Generic drug:  sildenafil      TAKE these medications        aspirin 325 MG EC tablet  Take 1 tablet (325 mg total) by mouth daily.     atorvastatin 10 MG  tablet  Commonly known as:  LIPITOR  Take 10 mg by mouth daily.     buPROPion 100 MG 12 hr tablet  Commonly known as:  WELLBUTRIN SR  Take 100 mg by mouth daily.     lisinopril 10 MG tablet  Commonly known as:  PRINIVIL,ZESTRIL  Take 1 tablet (10 mg total) by mouth daily.     metoprolol tartrate 12.5 mg Tabs tablet  Commonly known as:  LOPRESSOR  Take 0.5 tablets (12.5 mg total) by mouth 2 (two) times daily.     oxyCODONE 5 MG immediate release tablet  Commonly known as:  Oxy IR/ROXICODONE  Take 1-2 tablets (5-10 mg total) by mouth every 3 (three) hours as needed for severe pain.         The patient has been discharged on:   1.Beta Blocker:  Yes [  x ]                              No   [   ]  If No, reason:  2.Ace Inhibitor/ARB: Yes [ x  ]                                     No  [    ]                                     If No, reason:  3.Statin:   Yes [ x  ]                  No  [   ]                  If No, reason:  4.Ecasa:  Yes  [ x  ]                  No   [   ]                  If No, reason:    Follow Up Appointments: Follow-up Information    Follow up with Richardson Dopp, PA-C On 03/31/2014.   Specialty:  Physician Assistant   Why:  Appointment time is at 2:20 pm   Contact information:   [address] [address] [phone]       Follow up with Rexene Alberts, MD On 04/18/2014.   Specialty:  Cardiothoracic Surgery   Why:  PA/LAT CXR to be taken (at [facility] which is in the same building as Dr. Guy Sandifer office) on 04/11/2014 at 12:30 pm ;Appointment with Dr. Roxy Manns is at 1:30 pm   Contact information:   [address] [place] [address] [phone]       Signed: Suzzanne Cloud HPA-C 03/08/2014, 9:25 AM

## 2014-03-07 NOTE — Plan of Care (Signed)
Problem: Phase I Progression Outcomes Goal: Pain controlled with appropriate interventions Outcome: Completed/Met Date Met:  03/07/14     

## 2014-03-07 NOTE — Plan of Care (Signed)
Problem: Phase I Progression Outcomes Goal: Voiding-avoid urinary catheter unless indicated Outcome: Completed/Met Date Met:  03/07/14     

## 2014-03-07 NOTE — Progress Notes (Addendum)
      [address]       [place] [address]             [phone]        3 Days Post-Op Procedure(s) (LRB): CORONARY ARTERY BYPASS GRAFTING (CABG) (N/A) INTRAOPERATIVE TRANSESOPHAGEAL ECHOCARDIOGRAM (N/A)  Subjective: Patient without specific complaints this am  Objective: Vital signs in last 24 hours: Temp:  [98 F (36.7 C)-99 F (37.2 C)] 99 F (37.2 C) (11/16 0418) Pulse Rate:  [65-86] 72 (11/16 0418) Cardiac Rhythm:  [-] Normal sinus rhythm (11/15 2300) Resp:  [14-25] 18 (11/16 0418) BP: (100-141)/(54-97) 122/80 mmHg (11/16 0418) SpO2:  [90 %-100 %] 90 % (11/16 0418)  Pre op weight 65 kg Current Weight  03/06/14 151 lb 14.4 oz (68.9 kg)      Intake/Output from previous day: 11/15 0701 - 11/16 0700 In: 170 [P.O.:120; IV Piggyback:50] Out: 750 [Urine:550; Chest Tube:200]   Physical Exam:  Cardiovascular: RRR Pulmonary: Clear to auscultation bilaterally; no rales, wheezes, or rhonchi. Abdomen: Soft, non tender, bowel sounds present. Extremities: Trace lower extremity edema. Wounds: Sternal dressing removed and wound is clean and dry.  No erythema or signs of infection.  Lab Results: CBC: Recent Labs  03/06/14 0400 03/07/14 0315  WBC 8.6 6.3  HGB 10.5* 9.8*  HCT 32.4* 29.5*  PLT 144* 143*   BMET:  Recent Labs  03/06/14 0400 03/07/14 0315  NA 135* 137  K 4.1 4.1  CL 100 98  CO2 27 29  GLUCOSE 94 101*  BUN 14 13  CREATININE 0.75 0.68  CALCIUM 8.4 8.1*    PT/INR:  Lab Results  Component Value Date   INR 1.35 03/04/2014   INR 1.06 03/04/2014   INR 0.95 03/03/2014   ABG:  INR: Will add last result for INR, ABG once components are confirmed Will add last 4 CBG results once components are confirmed  Assessment/Plan:  1. CV - SR in the 70's. On Lopressor 12.5 bid. Will restart low dose Lisinopril. 2.  Pulmonary - CXR appears to show small bilateral pleural effusions, bibasilar atelectasis, and no  pneumothorax.Encourage incentive spirometer and flutter valve 3. Volume Overload - On Lasix 40 daily 4.  Acute blood loss anemia - H and H decreased to 9.8 and 29.5 5. Mid thrombocytopenia-platelets stable at 143,000 6. Not on ecasa so will start 7. Remove EPW 8. GI-no nausea or emesis.Advance diet as tolerates 9. Possible discharge in 1-2 days  ZIMMERMAN,DONIELLE MPA-C 03/07/2014,8:09 AM  I have seen and examined the patient and agree with the assessment and plan as outlined.  Looks very good.  Appetite starting to improve.  Possibly ready for d/c home 1-2 days.  OWEN,CLARENCE H 03/07/2014 10:56 AM

## 2014-03-07 NOTE — Discharge Instructions (Signed)
Activity: 1.May walk up steps °               2.No lifting more than ten pounds for four weeks.  °               3.No driving for four weeks. °               4.Stop any activity that causes chest pain, shortness of breath, dizziness, sweating or excessive weakness. °               5.Avoid straining. °               6.Continue with your breathing exercises daily. ° °Diet: Low fat, Low salt diet ° °Wound Care: May shower.  Clean wounds with mild soap and water daily. Contact the office at [phone] if any problems arise. ° ° °Coronary Artery Bypass Grafting, Care After °Refer to this sheet in the next few weeks. These instructions provide you with information on caring for yourself after your procedure. Your health care provider may also give you more specific instructions. Your treatment has been planned according to current medical practices, but problems sometimes occur. Call your health care provider if you have any problems or questions after your procedure. °WHAT TO EXPECT AFTER THE PROCEDURE °Recovery from surgery will be different for everyone. Some people feel well after 3 or 4 weeks, while for others it takes longer. After your procedure, it is typical to have the following: °· Nausea and a lack of appetite.   °· Constipation. °· Weakness and fatigue.   °· Depression or irritability.   °· Pain or discomfort at your incision site. °HOME CARE INSTRUCTIONS °· Take medicines only as directed by your health care provider. Do not stop taking medicines or start any new medicines without first checking with your health care provider. °· Take your pulse as directed by your health care provider. °· Perform deep breathing as directed by your health care provider. If you were given a device called an incentive spirometer, use it to practice deep breathing several times a day. Support your chest with a pillow or your arms when you take deep breaths or cough. °· Keep incision areas clean, dry, and protected. Remove or  change any bandages (dressings) only as directed by your health care provider. You may have skin adhesive strips over the incision areas. Do not take the strips off. They will fall off on their own. °· Check incision areas daily for any swelling, redness, or drainage. °· If incisions were made in your legs, do the following: °¨ Avoid crossing your legs.   °¨ Avoid sitting for long periods of time. Change positions every 30 minutes.   °¨ Elevate your legs when you are sitting. °· Wear compression stockings as directed by your health care provider. These stockings help keep blood clots from forming in your legs. °· Take showers once your health care provider approves. Until then, only take sponge baths. Pat incisions dry. Do not rub incisions with a washcloth or towel. Do not take baths, swim, or use a hot tub until your health care provider approves. °· Eat foods that are high in fiber, such as raw fruits and vegetables, whole grains, beans, and nuts. Meats should be lean cut. Avoid canned, processed, and fried foods. °· Drink enough fluid to keep your urine clear or pale yellow. °· Weigh yourself every day. This helps identify if you are retaining fluid that may make your heart and lungs work harder. °·   Rest and limit activity as directed by your health care provider. You may be instructed to: °¨ Stop any activity at once if you have chest pain, shortness of breath, irregular heartbeats, or dizziness. Get help right away if you have any of these symptoms. °¨ Move around frequently for short periods or take short walks as directed by your health care provider. Increase your activities gradually. You may need physical therapy or cardiac rehabilitation to help strengthen your muscles and build your endurance. °¨ Avoid lifting, pushing, or pulling anything heavier than 10 lb (4.5 kg) for at least 6 weeks after surgery. °· Do not drive until your health care provider approves.  °· Ask your health care provider when you  may return to work. °· Ask your health care provider when you may resume sexual activity. °· Keep all follow-up visits as directed by your health care provider. This is important. °SEEK MEDICAL CARE IF: °· You have swelling, redness, increasing pain, or drainage at the site of an incision. °· You have a fever. °· You have swelling in your ankles or legs. °· You have pain in your legs.   °· You gain 2 or more pounds (0.9 kg) a day. °· You are nauseous or vomit. °· You have diarrhea.  °SEEK IMMEDIATE MEDICAL CARE IF: °· You have chest pain that goes to your jaw or arms. °· You have shortness of breath.   °· You have a fast or irregular heartbeat.   °· You notice a "clicking" in your breastbone (sternum) when you move.   °· You have numbness or weakness in your arms or legs. °· You feel dizzy or light-headed.   °MAKE SURE YOU: °· Understand these instructions. °· Will watch your condition. °· Will get help right away if you are not doing well or get worse. °Document Released: 10/26/2004 Document Revised: 08/23/2013 Document Reviewed: 09/15/2012 °ExitCare® Patient Information ©2015 [employer]. This information is not intended to replace advice given to you by your health care provider. Make sure you discuss any questions you have with your health care provider. ° ° ° °

## 2014-03-08 MED ORDER — LISINOPRIL 10 MG PO TABS
10.0000 mg | ORAL_TABLET | Freq: Every day | ORAL | Status: DC
Start: 2014-03-08 — End: 2014-03-08
  Administered 2014-03-08: 10 mg via ORAL
  Filled 2014-03-08: qty 1

## 2014-03-08 MED ORDER — ASPIRIN 325 MG PO TBEC: 325.0000 mg | DELAYED_RELEASE_TABLET | Freq: Every day | ORAL | Status: DC

## 2014-03-08 MED ORDER — METOPROLOL TARTRATE 12.5 MG HALF TABLET
12.5000 mg | ORAL_TABLET | Freq: Two times a day (BID) | ORAL | Status: DC
Start: 2014-03-08 — End: 2014-03-31

## 2014-03-08 MED ORDER — LISINOPRIL 10 MG PO TABS: 10.0000 mg | ORAL_TABLET | Freq: Every day | ORAL | Status: DC

## 2014-03-08 MED ORDER — OXYCODONE HCL 5 MG PO TABS: 5.0000 mg | ORAL_TABLET | ORAL | Status: DC | PRN

## 2014-03-08 MED FILL — Mannitol IV Soln 20%: INTRAVENOUS | Qty: 500 | Status: AC

## 2014-03-08 MED FILL — Sodium Chloride IV Soln 0.9%: INTRAVENOUS | Qty: 2000 | Status: AC

## 2014-03-08 MED FILL — Lidocaine HCl IV Inj 20 MG/ML: INTRAVENOUS | Qty: 5 | Status: AC

## 2014-03-08 MED FILL — Sodium Bicarbonate IV Soln 8.4%: INTRAVENOUS | Qty: 50 | Status: AC

## 2014-03-08 MED FILL — Electrolyte-R (PH 7.4) Solution: INTRAVENOUS | Qty: 4000 | Status: AC

## 2014-03-08 MED FILL — Heparin Sodium (Porcine) Inj 1000 Unit/ML: INTRAMUSCULAR | Qty: 10 | Status: AC

## 2014-03-08 NOTE — Progress Notes (Signed)
Patient discharged per orders. Pt able to ambulate in room and pack belongings independently. Pt denies pain or discomfort at this time. All discharge instructions/medications/follow up care/appointments discussed as well as signs and symptoms that would require a call to the doctor or 911. Patient's spouse at bedside for discharge. Time allowed for questions and concerns. Pt states he has none at this time. Pt declined a wheelchair, preferred to ambulate to the heart and vascular entrance. I accompanied patient and his wife to the entrance for discharge home.  Roselyn Reef Topacio Cella,RN

## 2014-03-08 NOTE — Progress Notes (Signed)
Utilization review completed.  

## 2014-03-08 NOTE — Progress Notes (Addendum)
       [address]       [place] [address]             [phone]          4 Days Post-Op Procedure(s) (LRB): CORONARY ARTERY BYPASS GRAFTING (CABG) (N/A) INTRAOPERATIVE TRANSESOPHAGEAL ECHOCARDIOGRAM (N/A)  Subjective: Feels well, no complaints. Appetite improved and bowels working.   Objective: Vital signs in last 24 hours: Patient Vitals for the past 24 hrs:  BP Temp Temp src Pulse Resp SpO2 Weight  03/08/14 0456 (!) 155/77 mmHg 98.7 F (37.1 C) Oral 78 18 92 % 148 lb 5.9 oz (67.3 kg)  03/07/14 2138 (!) 147/79 mmHg 99.9 F (37.7 C) Oral 79 18 95 % -  03/07/14 1237 133/74 mmHg 98 F (36.7 C) Oral 86 18 99 % -  03/07/14 1200 131/78 mmHg - - - - - -  03/07/14 1146 132/78 mmHg - - - - - -  03/07/14 1133 129/79 mmHg - - - - - -  03/07/14 1114 122/67 mmHg 98.2 F (36.8 C) Oral 64 19 93 % -   Current Weight  03/08/14 148 lb 5.9 oz (67.3 kg)  BASELINE WEIGHT:65 kg   Intake/Output from previous day: 11/16 0701 - 11/17 0700 In: 483 [P.O.:480; I.V.:3] Out: 450 [Urine:450]    PHYSICAL EXAM:  Heart: RRR Lungs: Clear Wound: Clean and dry Extremities: No edema    Lab Results: CBC: Recent Labs  03/06/14 0400 03/07/14 0315  WBC 8.6 6.3  HGB 10.5* 9.8*  HCT 32.4* 29.5*  PLT 144* 143*   BMET:  Recent Labs  03/06/14 0400 03/07/14 0315  NA 135* 137  K 4.1 4.1  CL 100 98  CO2 27 29  GLUCOSE 94 101*  BUN 14 13  CREATININE 0.75 0.68  CALCIUM 8.4 8.1*    PT/INR: No results for input(s): LABPROT, INR in the last 72 hours.    Assessment/Plan: S/P Procedure(s) (LRB): CORONARY ARTERY BYPASS GRAFTING (CABG) (N/A) INTRAOPERATIVE TRANSESOPHAGEAL ECHOCARDIOGRAM (N/A)  CV- BPs improved, will continue Lopressor, increase Lisinopril.  D/c CT sutures, plan d/c home later today- instructions reviewed with patient.   LOS: 5 days    COLLINS,GINA H 03/08/2014   I have seen and examined the patient and agree with the assessment and  plan as outlined.  Diar Berkel H 03/08/2014 10:58 AM

## 2014-03-08 NOTE — Progress Notes (Signed)
This patient's Bmet and CBC are resulted in [place]. H&H has dropped since being in hospital.

## 2014-03-08 NOTE — Progress Notes (Signed)
Ed completed. Pt very receptive. Eats vegetarian diet and likes to ex. Very interested in [place] and will send referral to [facility]. [id number] Yves Dill CES, ACSM 10:39 AM 03/08/2014

## 2014-03-08 NOTE — Plan of Care (Signed)
Problem: Phase II Progression Outcomes Goal: Barriers To Progression Addressed/Resolved Outcome: Completed/Met Date Met:  03/08/14 Goal: Discharge plan in place and appropriate Outcome: Completed/Met Date Met:  03/08/14 Goal: Pain controlled with appropriate interventions Outcome: Completed/Met Date Met:  03/08/14 Goal: Hemodynamically stable Outcome: Completed/Met Date Met:  03/08/14 Goal: Ambulates up to 600 ft. in hall x 1 Outcome: Completed/Met Date Met:  02/54/27 Goal: Complications resolved/controlled Outcome: Completed/Met Date Met:  03/08/14 Goal: Tolerates diet Outcome: Completed/Met Date Met:  03/08/14 Goal: Activity appropriate for discharge plan Outcome: Completed/Met Date Met:  03/08/14 Goal: Vascular site scale level 0 - I Vascular Site Scale Level 0: No bruising/bleeding/hematoma Level I (Mild): Bruising/Ecchymosis, minimal bleeding/ooozing, palpable hematoma < 3 cm Level II (Moderate): Bleeding not affecting hemodynamic parameters, pseudoaneurysm, palpable hematoma > 3 cm Level III (Severe) Bleeding which affects hemodynamic parameters or retroperitoneal hemorrhage  Outcome: Completed/Met Date Met:  03/08/14 Goal: Other Discharge Outcomes/Goals Outcome: Not Applicable Date Met:  10/13/74  Problem: Consults Goal: Cardiac Surgery Patient Education ( See Patient Education module for education specifics.)  Outcome: Completed/Met Date Met:  03/08/14 Goal: Cardiac Rehab Consult Outcome: Completed/Met Date Met:  03/08/14 Goal: Skin Care Protocol Initiated - if Braden Score 18 or less If consults are not indicated, leave blank or document N/A  Outcome: Completed/Met Date Met:  03/08/14 Goal: Tobacco Cessation referral if indicated Outcome: Completed/Met Date Met:  03/08/14 Goal: Nutrition Consult-if indicated Outcome: Completed/Met Date Met:  03/08/14 Goal: Diabetes Guidelines if Diabetic/Glucose > 140 If diabetic or lab glucose is > 140 mg/dl - Initiate  Diabetes/Hyperglycemia Guidelines & Document Interventions  Outcome: Completed/Met Date Met:  03/08/14  Problem: Phase II - Intermediate Post-Op Goal: Advance Diet Outcome: Completed/Met Date Met:  03/08/14 Goal: Patient advanced to Phase III: Barriers addressed Outcome: Completed/Met Date Met:  03/08/14  Problem: Phase III - Recovery through Discharge Goal: Maintain Hemodynamic Stability Outcome: Completed/Met Date Met:  03/08/14 Goal: Activity Progressed Outcome: Completed/Met Date Met:  03/08/14 Goal: Discharge plan remains appropriate-arrangements made Outcome: Completed/Met Date Met:  03/08/14 Goal: Other Phase III Outcomes/Goals Outcome: Completed/Met Date Met:  03/08/14  Problem: Surgery Discharge Goal: Barriers To Progression Addressed/Resolved Outcome: Completed/Met Date Met:  03/08/14 Goal: Discharge plan in place and appropriate Outcome: Completed/Met Date Met:  03/08/14 Goal: Pain controlled with appropriate interventions Outcome: Completed/Met Date Met:  03/08/14 Goal: Tolerating diet Outcome: Completed/Met Date Met:  03/08/14 Goal: Activity appropriate for discharge plan Outcome: Completed/Met Date Met:  03/08/14 Goal: Other Discharge Outcomes/Goals Outcome: Completed/Met Date Met:  03/08/14

## 2014-03-08 NOTE — Progress Notes (Signed)
Needs to come in for test

## 2014-03-11 DIAGNOSIS — Z736 Limitation of activities due to disability: Secondary | ICD-10-CM

## 2014-03-16 ENCOUNTER — Other Ambulatory Visit: Payer: Self-pay

## 2014-03-16 DIAGNOSIS — D508 Other iron deficiency anemias: Secondary | ICD-10-CM

## 2014-03-16 NOTE — Progress Notes (Signed)
Just saw this note from Dr. Radford Pax in overdue results basket. Spoke to [employer], his wife, who will get him to the [facility] on Friday 03/18/2014.

## 2014-03-21 ENCOUNTER — Other Ambulatory Visit: Payer: BC Managed Care – PPO

## 2014-03-21 ENCOUNTER — Other Ambulatory Visit: Payer: Self-pay

## 2014-03-21 DIAGNOSIS — D508 Other iron deficiency anemias: Secondary | ICD-10-CM

## 2014-03-21 LAB — PLATELET INHIBITION P2Y12

## 2014-03-24 ENCOUNTER — Encounter: Payer: Self-pay | Admitting: Cardiology

## 2014-03-31 ENCOUNTER — Ambulatory Visit (INDEPENDENT_AMBULATORY_CARE_PROVIDER_SITE_OTHER): Payer: BC Managed Care – PPO | Admitting: Physician Assistant

## 2014-03-31 ENCOUNTER — Encounter (HOSPITAL_COMMUNITY): Payer: Self-pay | Admitting: Cardiovascular Disease

## 2014-03-31 VITALS — BP 100/70 | HR 62 | Ht 68.0 in | Wt 144.0 lb

## 2014-03-31 DIAGNOSIS — E785 Hyperlipidemia, unspecified: Secondary | ICD-10-CM

## 2014-03-31 DIAGNOSIS — I2510 Atherosclerotic heart disease of native coronary artery without angina pectoris: Secondary | ICD-10-CM

## 2014-03-31 DIAGNOSIS — I10 Essential (primary) hypertension: Secondary | ICD-10-CM

## 2014-03-31 MED ORDER — METOPROLOL TARTRATE 25 MG PO TABS: 12.5000 mg | ORAL_TABLET | Freq: Two times a day (BID) | ORAL | Status: DC

## 2014-03-31 MED ORDER — ASPIRIN 81 MG PO TBEC: 81.0000 mg | DELAYED_RELEASE_TABLET | Freq: Every day | ORAL | Status: AC

## 2014-03-31 MED ORDER — ATORVASTATIN CALCIUM 20 MG PO TABS: 20.0000 mg | ORAL_TABLET | Freq: Every day | ORAL | Status: DC

## 2014-03-31 NOTE — Progress Notes (Signed)
Cardiology Office Note   Date:  03/31/2014   ID:  EYTHAN JAYNE, DOB 1950-09-25, MRN [id number]  PCP:  Dorian Heckle, MD  Cardiologist:  Dr. Fransico Him     History of Present Illness: Travis Harrison is a 63 y.o. male with a hx of HTN, HL.  He was evaluated by Dr. Fransico Him last month for symptoms c/w crescendo angina.  LHC was arranged and demonstrated severe LM stenosis and normal LVF.  He was referred for CABG.  CABG was done by Dr. Roxy Manns with a L-LAD and RIMA-OM1.  Pre-op carotid US demonstrated RICA 47-65% and LICA 4-65%.  Post op course was unremarkable.  He remained in NSR.  He returns for FU.  His wife works in our office and she is with him today.  He still has a lot of sternal discomfort.  He is taking Oxycodone.  He denies significant dyspnea.  Denies fever, cough.  No orthopnea, PND, edema.  No syncope or dizziness.  He starts cardiac rehab at the end of the month.  He has been walking ~ 1 mile a day.     Studies:   - LHC (11/15): prox LM 90%, mLAD 30-40%, mCFX 50%, dRCA 30%, PDA 50%, EF 65% >> CABG x 2 (L-LAD, RIMA-OM1)   - Carotid US (11/15):  R 40-59%; L 1-39%   Recent Labs: 03/03/2014: ALT 34; TSH 1.860 03/04/2014: LDL (calc) 98 03/07/2014: BUN 13; Creatinine 0.68; Hemoglobin 9.8*; Potassium 4.1; Sodium 137    Recent Radiology:     Wt Readings from Last 3 Encounters:  03/08/14 148 lb 5.9 oz (67.3 kg)  03/03/14 147 lb 6.4 oz (66.86 kg)     Past Medical History  Diagnosis Date  . Hyperlipidemia   . Hypertension   . 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 aspirin EC 325 MG EC tablet Take 1 tablet (325 mg total) by mouth daily. 30 tablet 0  . atorvastatin (LIPITOR) 10 MG tablet Take 10 mg by mouth daily.    Marland Kitchen buPROPion (WELLBUTRIN SR) 100 MG 12 hr tablet Take 100 mg by mouth daily.    Marland Kitchen lisinopril (PRINIVIL,ZESTRIL) 10 MG tablet Take 1 tablet (10 mg total) by  mouth daily. 30 tablet 1  . metoprolol tartrate (LOPRESSOR) 12.5 mg TABS tablet Take 0.5 tablets (12.5 mg total) by mouth 2 (two) times daily. 30 tablet 1  . oxyCODONE (OXY IR/ROXICODONE) 5 MG immediate release tablet Take 1-2 tablets (5-10 mg total) by mouth every 3 (three) hours as needed for severe pain. 30 tablet 0   Current Facility-Administered Medications  Medication Dose Route Frequency Provider Last Rate Last Dose  . nitroGLYCERIN (NITROSTAT) SL tablet 0.4 mg  0.4 mg Sublingual Q5 min PRN Sueanne Margarita, MD   0.4 mg at 03/03/14 1042     Allergies:   Review of patient's allergies indicates no known allergies.   Social History:  The patient  reports that he quit smoking about 15 years ago. He does not have any smokeless tobacco history on file. He reports that he drinks about 24.0 oz of alcohol per week.   Family History:  The patient's family history includes CVA in his father; Heart attack in his mother; Heart attack (age of onset: 17) in his father; Heart disease in his father and mother; Hypertension in his father.    ROS:  Please  see the history of present illness.       All other systems reviewed and negative.    PHYSICAL EXAM: VS:  BP 100/70 mmHg  Pulse 62  Ht 5\' 8"  (1.727 m)  Wt 144 lb (65.318 kg)  BMI 21.90 kg/m2 Well nourished, well developed, in no acute distress HEENT: normal Neck: no JVD Cardiac:  normal S1, S2;  RRR; no murmur   Chest:  Median sternotomy wound well healed without erythema or discharge. Lungs:   clear to auscultation bilaterally, no wheezing, rhonchi or rales Abd: soft, nontender, no hepatomegaly Ext: no edema Skin: warm and dry Neuro:  CNs 2-12 intact, no focal abnormalities noted  EKG:  NSR, HR 62, normal axis, NSSTTW changes.       ASSESSMENT AND PLAN:  1.  Coronary Artery Disease:  Doing well after recent CABG for severe LM disease.  He is progressing well but has continued chest pain.  I have asked him to try to take Tylenol prn.  I  have encouraged him to continue to walk and to attend cardiac rehab.  He has stopped smoking cigarettes.      -  Continue ASA, statin, beta blocker.    -  He can reduce ASA to 81 mg QD. 2.  Hypertension:  Controlled.  3.  Hyperlipidemia:  LDL 96 last month.      -  Increase Lipitor to 20 mg QD.    -  Check Lipids and LFTs 6 weeks.   Disposition:   FU with Dr. Fransico Him 6-8 weeks.,    Signed, Versie Starks, MHS 03/31/2014 2:22 PM    [facility] [place], [place], [place]  [address] Phone: [phone]; Fax: [phone]

## 2014-03-31 NOTE — Patient Instructions (Addendum)
After you finish the bottle of Aspirin 325 mg, you can decrease to 81 mg daily.  Increase Lipitor (Atorvastatin) to 20 mg daily.  Your physician recommends that you return for lab work in: [place] 05/06/2014  Your physician recommends that you schedule a follow-up appointment in:  DR. TURNER Friday, 05/20/2014 @ 10AM

## 2014-04-12 ENCOUNTER — Encounter (HOSPITAL_COMMUNITY)
Admission: RE | Admit: 2014-04-12 | Discharge: 2014-04-12 | Disposition: A | Discharge status: Home / Self Care | Payer: BLUE CROSS/BLUE SHIELD | Source: Ambulatory Visit | Attending: Cardiology | Admitting: Cardiology

## 2014-04-12 DIAGNOSIS — Z951 Presence of aortocoronary bypass graft: Secondary | ICD-10-CM | POA: Insufficient documentation

## 2014-04-12 DIAGNOSIS — Z8249 Family history of ischemic heart disease and other diseases of the circulatory system: Secondary | ICD-10-CM | POA: Insufficient documentation

## 2014-04-12 DIAGNOSIS — I25110 Atherosclerotic heart disease of native coronary artery with unstable angina pectoris: Secondary | ICD-10-CM | POA: Insufficient documentation

## 2014-04-12 DIAGNOSIS — Z7982 Long term (current) use of aspirin: Secondary | ICD-10-CM | POA: Insufficient documentation

## 2014-04-12 DIAGNOSIS — Z87891 Personal history of nicotine dependence: Secondary | ICD-10-CM | POA: Insufficient documentation

## 2014-04-12 DIAGNOSIS — E785 Hyperlipidemia, unspecified: Secondary | ICD-10-CM | POA: Insufficient documentation

## 2014-04-12 DIAGNOSIS — Z5189 Encounter for other specified aftercare: Secondary | ICD-10-CM | POA: Insufficient documentation

## 2014-04-12 DIAGNOSIS — I77810 Thoracic aortic ectasia: Secondary | ICD-10-CM | POA: Insufficient documentation

## 2014-04-12 DIAGNOSIS — I10 Essential (primary) hypertension: Secondary | ICD-10-CM | POA: Insufficient documentation

## 2014-04-12 NOTE — Progress Notes (Signed)
Cardiac Rehab Medication Review by a Pharmacist  Does the patient  feel that his/her medications are working for him/her?  yes  Has the patient been experiencing any side effects to the medications prescribed?  no  Does the patient measure his/her own blood pressure or blood glucose at home?  yes   Does the patient have any problems obtaining medications due to transportation or finances?   no  Understanding of regimen: good Understanding of indications: fair Potential of compliance: good    Pharmacist comments: Patient does not have any barriers to obtaining medications.  He remembers to take his medications except sometimes forgets to take the aspirin.  He measures his own blood pressure at home and feels his medications are working well for him.  Travis Harrison L. [name], PharmD Clinical Pharmacy Resident Pager: [phone] 04/12/2014 8:11 AM

## 2014-04-13 ENCOUNTER — Other Ambulatory Visit: Payer: Self-pay | Admitting: Thoracic Surgery (Cardiothoracic Vascular Surgery)

## 2014-04-13 DIAGNOSIS — Z951 Presence of aortocoronary bypass graft: Secondary | ICD-10-CM

## 2014-04-18 ENCOUNTER — Encounter: Payer: Self-pay | Admitting: Thoracic Surgery (Cardiothoracic Vascular Surgery)

## 2014-04-18 ENCOUNTER — Ambulatory Visit
Admission: RE | Admit: 2014-04-18 | Discharge: 2014-04-18 | Disposition: A | Discharge status: Home / Self Care | Payer: BC Managed Care – PPO | Source: Ambulatory Visit | Attending: Thoracic Surgery (Cardiothoracic Vascular Surgery) | Admitting: Thoracic Surgery (Cardiothoracic Vascular Surgery)

## 2014-04-18 ENCOUNTER — Ambulatory Visit (INDEPENDENT_AMBULATORY_CARE_PROVIDER_SITE_OTHER): Payer: Self-pay | Admitting: Thoracic Surgery (Cardiothoracic Vascular Surgery)

## 2014-04-18 VITALS — BP 121/80 | HR 62 | Resp 20 | Ht 69.5 in | Wt 145.0 lb

## 2014-04-18 DIAGNOSIS — Z951 Presence of aortocoronary bypass graft: Secondary | ICD-10-CM

## 2014-04-18 IMAGING — CR DG CHEST 2V
2 series · 2 of 2 positions shown · non-contrast
Comparison: [DATE].

CLINICAL DATA: Status post coronary artery bypass graft.

EXAM:
CHEST  2 VIEW

[w chest pa]
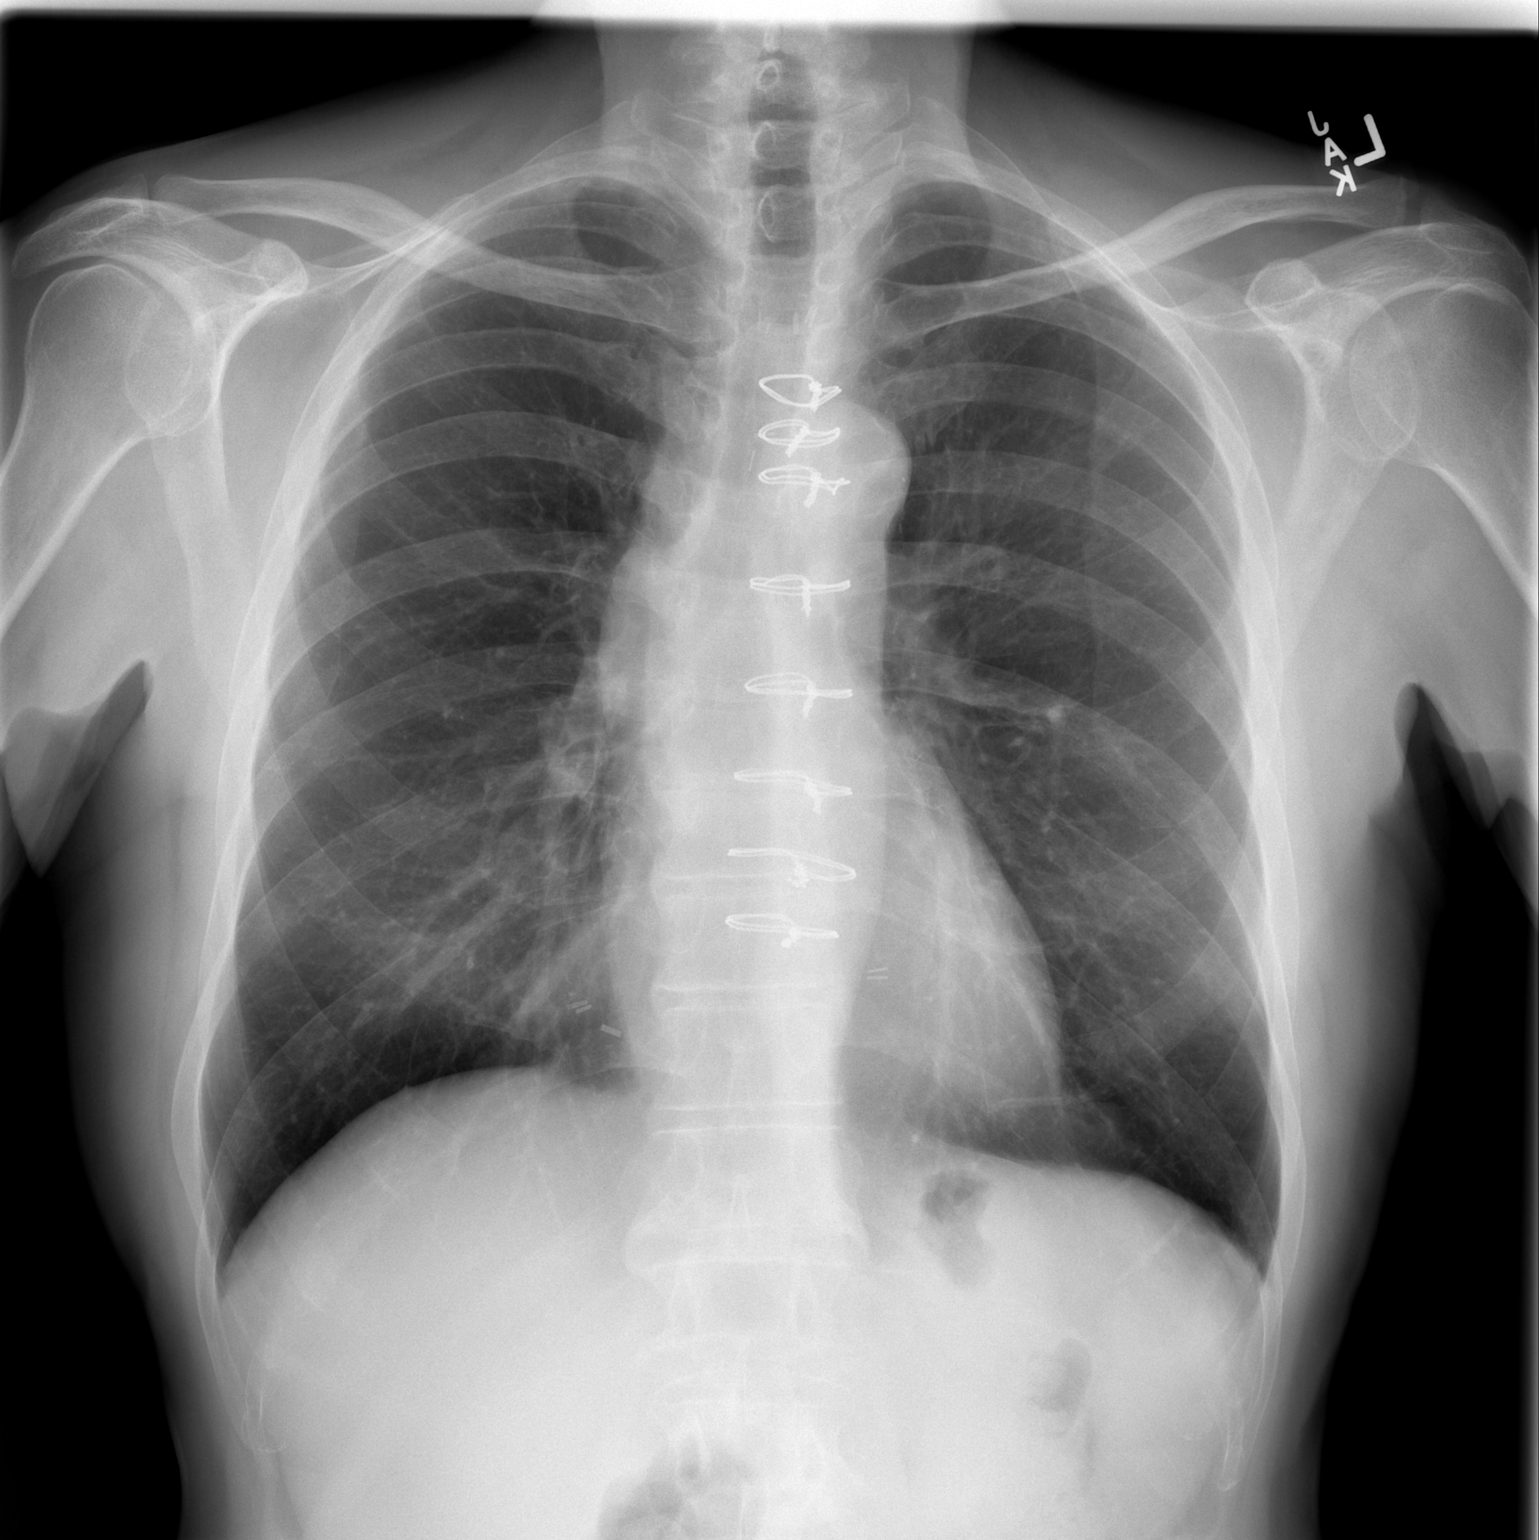

[w chest lat]
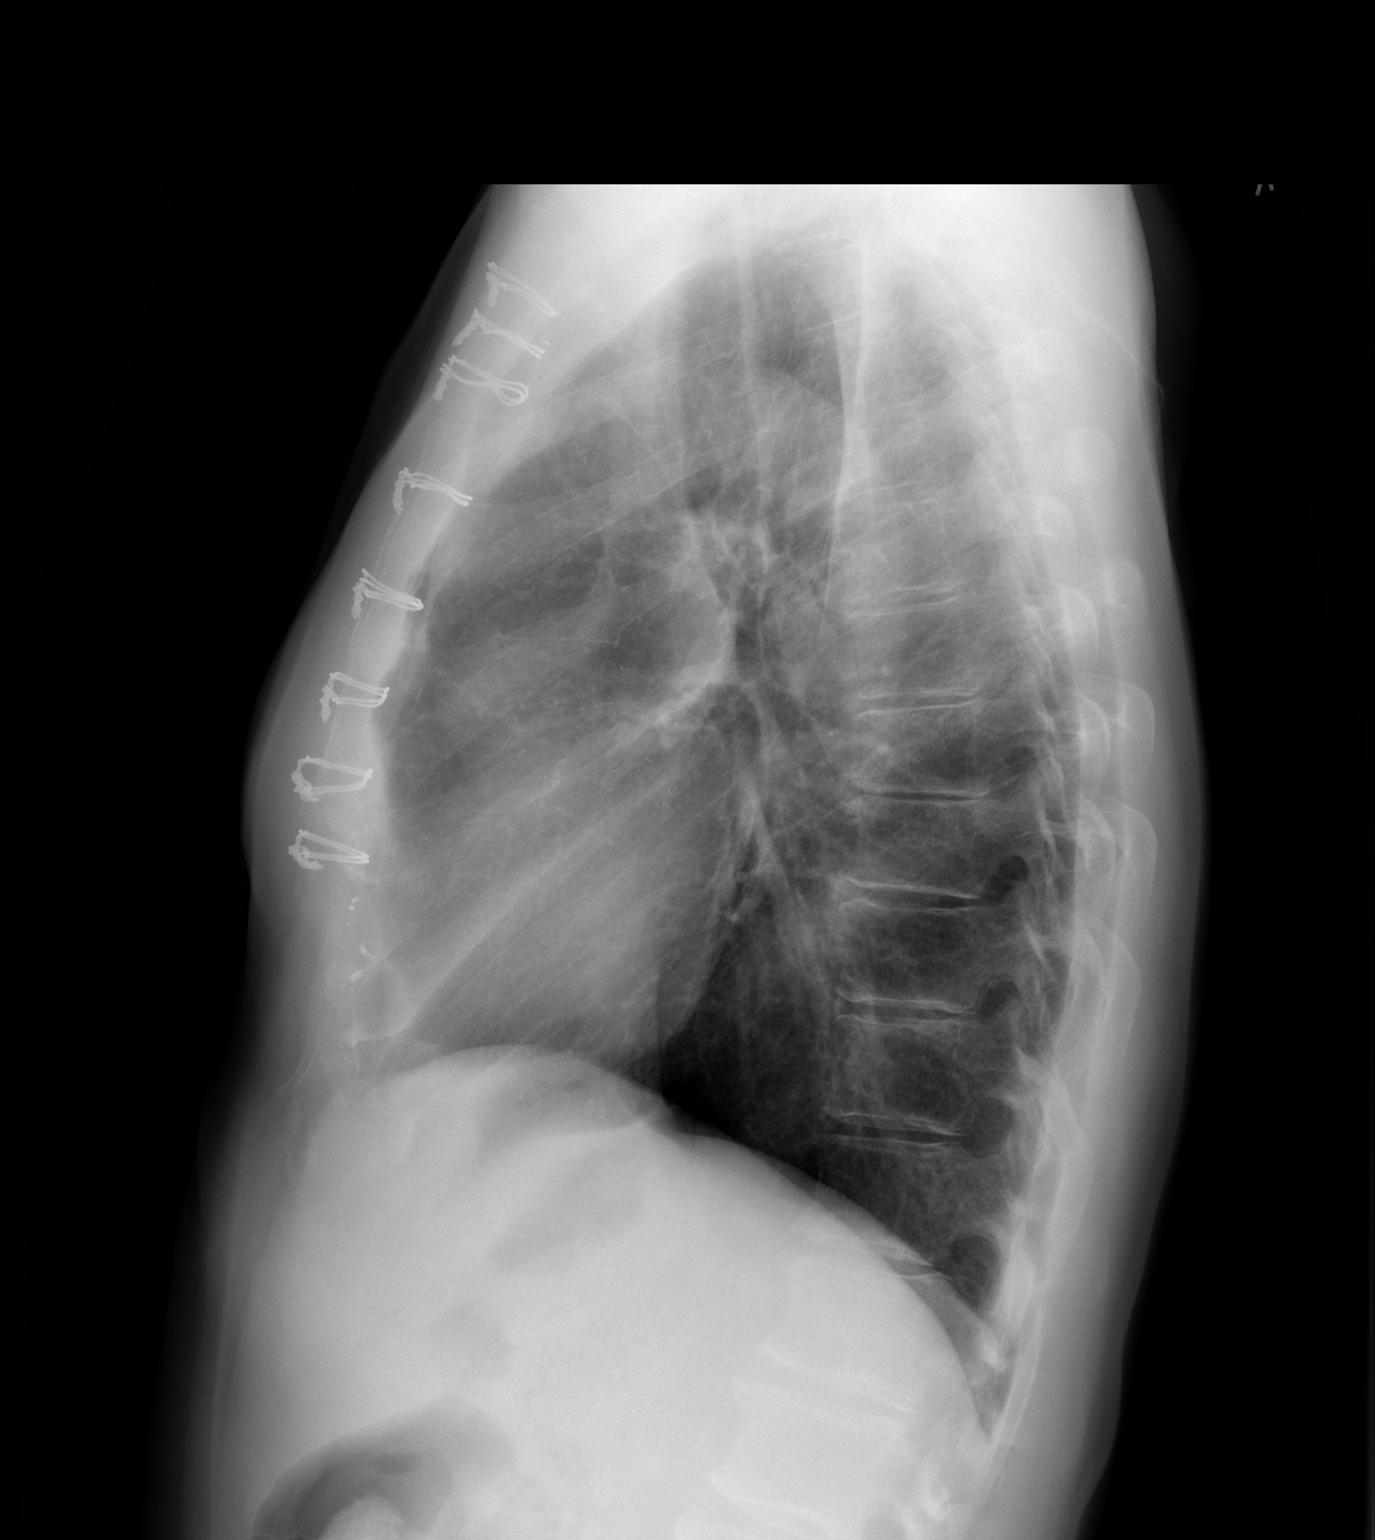

[2 of 2 positions shown; findings below may reference images not displayed]

FINDINGS: The heart size and mediastinal contours are within normal limits.
Both lungs are clear. Sternotomy wires are noted. No pneumothorax or
pleural effusion is noted. The visualized skeletal structures are
unremarkable.
IMPRESSION: No acute cardiopulmonary abnormality seen.

## 2014-04-18 NOTE — Progress Notes (Signed)
[address]       [place] [address]             [phone]     CARDIOTHORACIC SURGERY OFFICE NOTE  Referring Provider is Sueanne Margarita, MD PCP is Dorian Heckle, MD   HPI:  Patient returns to the office today for routine follow-up status post coronary artery bypass grafting 2 on 03/04/2014 for severe left main coronary artery disease with unstable angina. His postoperative recovery has been uncomplicated and he was discharged from the hospital on the fourth postoperative day.  Since hospital discharge he has continued to do well, and he was seen in follow-up by Richardson Dopp at [facility] on 03/31/2014.  He returns to the office for routine follow-up. He is now walking up to 2 miles every day at a brisk pace. He has no exertional shortness of breath whatsoever. He has not had any exertional chest discomfort. He has mild residual soreness in his chest but he is no longer requiring any sort of oral pain relievers. His activity level is quite good. His appetite is good. He is sleeping well at night. He has no complaints.   Current Outpatient Prescriptions  Medication Sig Dispense Refill  . aspirin 81 MG EC tablet Take 1 tablet (81 mg total) by mouth daily.    Marland Kitchen atorvastatin (LIPITOR) 20 MG tablet Take 20 mg by mouth daily.    Marland Kitchen buPROPion (WELLBUTRIN SR) 100 MG 12 hr tablet Take 100 mg by mouth daily.    Marland Kitchen lisinopril (PRINIVIL,ZESTRIL) 10 MG tablet Take 1 tablet (10 mg total) by mouth daily. 30 tablet 1  . metoprolol tartrate (LOPRESSOR) 25 MG tablet Take 0.5 tablets (12.5 mg total) by mouth 2 (two) times daily. 90 tablet 3  . Omega-3 Fatty Acids (FISH OIL) 1000 MG CAPS Take 1 capsule by mouth daily.    Marland Kitchen oxyCODONE (OXY IR/ROXICODONE) 5 MG immediate release tablet Take 1-2 tablets (5-10 mg total) by mouth every 3 (three) hours as needed for severe pain. 30 tablet 0   Current Facility-Administered Medications  Medication Dose Route Frequency Provider Last  Rate Last Dose  . nitroGLYCERIN (NITROSTAT) SL tablet 0.4 mg  0.4 mg Sublingual Q5 min PRN Sueanne Margarita, MD   0.4 mg at 03/03/14 1042      Physical Exam:   BP 121/80 mmHg  Pulse 62  Resp 20  Ht 5' 9.5" (1.765 m)  Wt 145 lb (65.772 kg)  BMI 21.11 kg/m2  SpO2 99%  General:  Well-appearing  Chest:   Clear  CV:   Regular rate and rhythm without murmur  Incisions:  Healing nicely, sternum is stable  Abdomen:  Soft and nontender  Extremities:  Warm and well-perfused  Diagnostic Tests:  CHEST 2 VIEW  COMPARISON: March 07, 2014.  FINDINGS: The heart size and mediastinal contours are within normal limits. Both lungs are clear. Sternotomy wires are noted. No pneumothorax or pleural effusion is noted. The visualized skeletal structures are unremarkable.  IMPRESSION: No acute cardiopulmonary abnormality seen.   Electronically Signed  By: Sabino Dick M.D.  On: 04/18/2014 12:35   Impression:  Patient is doing very well proximally 6 weeks status post coronary artery bypass grafting x2 using bilateral internal mammary arteries.  Plan:  I have encouraged the patient to continue to gradually increase his physical activity as tolerated with his only significant limitation remaining that he refrain from heavy lifting or strenuous use of his arms or shoulders  for at least another 2 months. Because he has to do a fair amount of lifting at work, we have discussed timing for return to work. I have encouraged him to enroll in outpatient cardiac rehabilitation program. All of his questions been addressed. We have not made any changes to his current medications.  He will return for routine follow-up next November, proximally 1 year following his original surgery.   Valentina Gu. Roxy Manns, MD 04/18/2014 2:07 PM

## 2014-04-18 NOTE — Patient Instructions (Signed)
The patient should continue to avoid any heavy lifting or strenuous use of arms or shoulders for at least a total of three months from the time of surgery.  The patient may return to driving an automobile as long as they are no longer requiring oral narcotic pain relievers during the daytime.  It would be wise to start driving only short distances during the daylight and gradually increase from there as they feel comfortable.  The patient is encouraged to enroll and participate in the outpatient cardiac rehab program beginning as soon as practical.  

## 2014-04-20 ENCOUNTER — Encounter (HOSPITAL_COMMUNITY)
Admission: RE | Admit: 2014-04-20 | Discharge: 2014-04-20 | Disposition: A | Discharge status: Home / Self Care | Payer: BLUE CROSS/BLUE SHIELD | Source: Ambulatory Visit | Attending: Cardiology | Admitting: Cardiology

## 2014-04-20 DIAGNOSIS — I77810 Thoracic aortic ectasia: Secondary | ICD-10-CM | POA: Diagnosis not present

## 2014-04-20 DIAGNOSIS — Z951 Presence of aortocoronary bypass graft: Secondary | ICD-10-CM | POA: Diagnosis not present

## 2014-04-20 DIAGNOSIS — Z7982 Long term (current) use of aspirin: Secondary | ICD-10-CM | POA: Diagnosis not present

## 2014-04-20 DIAGNOSIS — Z5189 Encounter for other specified aftercare: Secondary | ICD-10-CM | POA: Diagnosis present

## 2014-04-20 DIAGNOSIS — Z87891 Personal history of nicotine dependence: Secondary | ICD-10-CM | POA: Diagnosis not present

## 2014-04-20 DIAGNOSIS — I25110 Atherosclerotic heart disease of native coronary artery with unstable angina pectoris: Secondary | ICD-10-CM | POA: Diagnosis not present

## 2014-04-20 DIAGNOSIS — I10 Essential (primary) hypertension: Secondary | ICD-10-CM | POA: Diagnosis not present

## 2014-04-20 DIAGNOSIS — Z8249 Family history of ischemic heart disease and other diseases of the circulatory system: Secondary | ICD-10-CM | POA: Diagnosis not present

## 2014-04-20 DIAGNOSIS — E785 Hyperlipidemia, unspecified: Secondary | ICD-10-CM | POA: Diagnosis not present

## 2014-04-20 NOTE — Progress Notes (Signed)
Pt in today for his first day of exercise in the 8:15 cardiac rehab phase II program.  Pt tolerated exercise with no complaints.  Monitor showed Sr with no noted ectopy.  Medication list reconciled.  Pt verbalized compliance to medication therapy.  PHQ2 score 0.  Pt feels he has the support needed to be successful in cardiac rehab.  Pt with long history - 20 years of taking wellbutrin SR and feels this works well for him. Short term goal is to expand breathing.  Pt feels his breathing is limited due to incisional discomfort felt while taking a deep breath.  Advised pt that exercise will help with this and also demonstrated diaphragmatic breathing.  Long term goal is healthier lifestyle.  Will monitor pt progress toward this goal.  Maurice Small RN, BSN

## 2014-04-25 ENCOUNTER — Encounter (HOSPITAL_COMMUNITY)
Admission: RE | Admit: 2014-04-25 | Discharge: 2014-04-25 | Disposition: A | Discharge status: Home / Self Care | Payer: BLUE CROSS/BLUE SHIELD | Source: Ambulatory Visit | Attending: Cardiology | Admitting: Cardiology

## 2014-04-25 DIAGNOSIS — Z951 Presence of aortocoronary bypass graft: Secondary | ICD-10-CM | POA: Diagnosis not present

## 2014-04-25 DIAGNOSIS — I10 Essential (primary) hypertension: Secondary | ICD-10-CM | POA: Diagnosis not present

## 2014-04-25 DIAGNOSIS — Z87891 Personal history of nicotine dependence: Secondary | ICD-10-CM | POA: Insufficient documentation

## 2014-04-25 DIAGNOSIS — Z8249 Family history of ischemic heart disease and other diseases of the circulatory system: Secondary | ICD-10-CM | POA: Diagnosis not present

## 2014-04-25 DIAGNOSIS — I25110 Atherosclerotic heart disease of native coronary artery with unstable angina pectoris: Secondary | ICD-10-CM | POA: Insufficient documentation

## 2014-04-25 DIAGNOSIS — Z5189 Encounter for other specified aftercare: Secondary | ICD-10-CM | POA: Diagnosis not present

## 2014-04-25 DIAGNOSIS — I77810 Thoracic aortic ectasia: Secondary | ICD-10-CM | POA: Diagnosis not present

## 2014-04-25 DIAGNOSIS — E785 Hyperlipidemia, unspecified: Secondary | ICD-10-CM | POA: Insufficient documentation

## 2014-04-25 DIAGNOSIS — Z7982 Long term (current) use of aspirin: Secondary | ICD-10-CM | POA: Insufficient documentation

## 2014-04-27 ENCOUNTER — Encounter (HOSPITAL_COMMUNITY)
Admission: RE | Admit: 2014-04-27 | Discharge: 2014-04-27 | Disposition: A | Discharge status: Home / Self Care | Payer: BLUE CROSS/BLUE SHIELD | Source: Ambulatory Visit | Attending: Cardiology | Admitting: Cardiology

## 2014-04-27 DIAGNOSIS — Z5189 Encounter for other specified aftercare: Secondary | ICD-10-CM | POA: Diagnosis not present

## 2014-04-27 NOTE — Progress Notes (Signed)
Reviewed home exercise guidelines with patient including endpoints, temperature precautions, target heart rate and rate of perceived exertion. Pt is walking 45 minutes on the days he doesn't come to cardiac rehab as his mode of home exercise. Pt voices understanding of instructions given. Sol Passer, MS, ACSM CCEP

## 2014-04-29 ENCOUNTER — Encounter (HOSPITAL_COMMUNITY)
Admission: RE | Admit: 2014-04-29 | Discharge: 2014-04-29 | Disposition: A | Discharge status: Home / Self Care | Payer: BLUE CROSS/BLUE SHIELD | Source: Ambulatory Visit | Attending: Cardiology | Admitting: Cardiology

## 2014-04-29 DIAGNOSIS — Z5189 Encounter for other specified aftercare: Secondary | ICD-10-CM | POA: Diagnosis not present

## 2014-05-02 ENCOUNTER — Encounter (HOSPITAL_COMMUNITY)
Admission: RE | Admit: 2014-05-02 | Discharge: 2014-05-02 | Disposition: A | Discharge status: Home / Self Care | Payer: BLUE CROSS/BLUE SHIELD | Source: Ambulatory Visit | Attending: Cardiology | Admitting: Cardiology

## 2014-05-02 DIAGNOSIS — Z5189 Encounter for other specified aftercare: Secondary | ICD-10-CM | POA: Diagnosis not present

## 2014-05-04 ENCOUNTER — Encounter (HOSPITAL_COMMUNITY)
Admission: RE | Admit: 2014-05-04 | Discharge: 2014-05-04 | Disposition: A | Discharge status: Home / Self Care | Payer: BLUE CROSS/BLUE SHIELD | Source: Ambulatory Visit | Attending: Cardiology | Admitting: Cardiology

## 2014-05-04 DIAGNOSIS — Z5189 Encounter for other specified aftercare: Secondary | ICD-10-CM | POA: Diagnosis not present

## 2014-05-06 ENCOUNTER — Other Ambulatory Visit: Payer: Self-pay | Admitting: *Deleted

## 2014-05-06 ENCOUNTER — Other Ambulatory Visit (INDEPENDENT_AMBULATORY_CARE_PROVIDER_SITE_OTHER): Payer: BLUE CROSS/BLUE SHIELD | Admitting: *Deleted

## 2014-05-06 ENCOUNTER — Encounter (HOSPITAL_COMMUNITY): Payer: BLUE CROSS/BLUE SHIELD

## 2014-05-06 DIAGNOSIS — E785 Hyperlipidemia, unspecified: Secondary | ICD-10-CM

## 2014-05-06 LAB — HEPATIC FUNCTION PANEL
ALT: 39 U/L (ref 0–53)
AST: 41 U/L — ABNORMAL HIGH (ref 0–37)
Albumin: 4.2 g/dL (ref 3.5–5.2)
Alkaline Phosphatase: 67 U/L (ref 39–117)
BILIRUBIN DIRECT: 0.1 mg/dL (ref 0.0–0.3)
BILIRUBIN TOTAL: 0.5 mg/dL (ref 0.2–1.2)
Total Protein: 7.3 g/dL (ref 6.0–8.3)

## 2014-05-06 LAB — LIPID PANEL
CHOL/HDL RATIO: 4
Cholesterol: 186 mg/dL (ref 0–200)
HDL: 49.1 mg/dL (ref >39.00)
LDL CALC: 97 mg/dL (ref 0–99)
NonHDL: 136.9
TRIGLYCERIDES: 198 mg/dL — AB (ref 0.0–149.0)
VLDL: 39.6 mg/dL (ref 0.0–40.0)

## 2014-05-09 ENCOUNTER — Encounter (HOSPITAL_COMMUNITY): Payer: BLUE CROSS/BLUE SHIELD

## 2014-05-11 ENCOUNTER — Encounter (HOSPITAL_COMMUNITY): Payer: BLUE CROSS/BLUE SHIELD

## 2014-05-13 ENCOUNTER — Encounter (HOSPITAL_COMMUNITY): Payer: BLUE CROSS/BLUE SHIELD

## 2014-05-16 ENCOUNTER — Other Ambulatory Visit: Payer: Self-pay

## 2014-05-16 ENCOUNTER — Encounter (HOSPITAL_COMMUNITY)
Admission: RE | Admit: 2014-05-16 | Discharge: 2014-05-16 | Disposition: A | Discharge status: Home / Self Care | Payer: BLUE CROSS/BLUE SHIELD | Source: Ambulatory Visit | Attending: Cardiology | Admitting: Cardiology

## 2014-05-16 DIAGNOSIS — E785 Hyperlipidemia, unspecified: Secondary | ICD-10-CM

## 2014-05-16 DIAGNOSIS — Z5189 Encounter for other specified aftercare: Secondary | ICD-10-CM | POA: Diagnosis not present

## 2014-05-16 NOTE — Progress Notes (Signed)
Pt returned to cardiac rehab today after absence due to car trouble.  Pt able to exercise without difficulty.

## 2014-05-17 ENCOUNTER — Other Ambulatory Visit: Payer: BLUE CROSS/BLUE SHIELD

## 2014-05-18 ENCOUNTER — Other Ambulatory Visit (INDEPENDENT_AMBULATORY_CARE_PROVIDER_SITE_OTHER): Payer: BLUE CROSS/BLUE SHIELD | Admitting: *Deleted

## 2014-05-18 ENCOUNTER — Encounter (HOSPITAL_COMMUNITY)
Admission: RE | Admit: 2014-05-18 | Discharge: 2014-05-18 | Disposition: A | Discharge status: Home / Self Care | Payer: BLUE CROSS/BLUE SHIELD | Source: Ambulatory Visit | Attending: Cardiology | Admitting: Cardiology

## 2014-05-18 DIAGNOSIS — E785 Hyperlipidemia, unspecified: Secondary | ICD-10-CM

## 2014-05-18 DIAGNOSIS — Z736 Limitation of activities due to disability: Secondary | ICD-10-CM

## 2014-05-18 DIAGNOSIS — Z5189 Encounter for other specified aftercare: Secondary | ICD-10-CM | POA: Diagnosis not present

## 2014-05-18 LAB — LIPID PANEL
CHOL/HDL RATIO: 4
Cholesterol: 169 mg/dL (ref 0–200)
HDL: 48 mg/dL (ref >39.00)
NonHDL: 121
Triglycerides: 244 mg/dL — ABNORMAL HIGH (ref 0.0–149.0)
VLDL: 48.8 mg/dL — ABNORMAL HIGH (ref 0.0–40.0)

## 2014-05-18 LAB — HEPATIC FUNCTION PANEL
ALK PHOS: 65 U/L (ref 39–117)
ALT: 31 U/L (ref 0–53)
AST: 27 U/L (ref 0–37)
Albumin: 3.9 g/dL (ref 3.5–5.2)
Bilirubin, Direct: 0 mg/dL (ref 0.0–0.3)
Total Bilirubin: 0.4 mg/dL (ref 0.2–1.2)
Total Protein: 6.5 g/dL (ref 6.0–8.3)

## 2014-05-18 LAB — LDL CHOLESTEROL, DIRECT: Direct LDL: 87 mg/dL

## 2014-05-19 ENCOUNTER — Telehealth: Payer: Self-pay

## 2014-05-19 DIAGNOSIS — E785 Hyperlipidemia, unspecified: Secondary | ICD-10-CM

## 2014-05-19 NOTE — Telephone Encounter (Signed)
-----   Message from Sueanne Margarita, MD sent at 05/18/2014  3:49 PM EST ----- LDL still not at goal - increase Lipitor to 40mg  daily and recheck FLP and ALT in 6 weeks

## 2014-05-19 NOTE — Telephone Encounter (Signed)
Patient informed of results and verbal understanding expressed.  Instructed patient to INCREASE Lipitor to 40 mg daily.  Patient to call back to schedule lab appointment.

## 2014-05-20 ENCOUNTER — Encounter: Payer: Self-pay | Admitting: Cardiology

## 2014-05-20 ENCOUNTER — Encounter (HOSPITAL_COMMUNITY)
Admission: RE | Admit: 2014-05-20 | Discharge: 2014-05-20 | Disposition: A | Discharge status: Home / Self Care | Payer: BLUE CROSS/BLUE SHIELD | Source: Ambulatory Visit | Attending: Cardiology | Admitting: Cardiology

## 2014-05-20 ENCOUNTER — Ambulatory Visit (INDEPENDENT_AMBULATORY_CARE_PROVIDER_SITE_OTHER): Payer: BLUE CROSS/BLUE SHIELD | Admitting: Cardiology

## 2014-05-20 VITALS — BP 120/88 | HR 77 | Ht 68.0 in | Wt 150.0 lb

## 2014-05-20 DIAGNOSIS — I10 Essential (primary) hypertension: Secondary | ICD-10-CM

## 2014-05-20 DIAGNOSIS — Z951 Presence of aortocoronary bypass graft: Secondary | ICD-10-CM

## 2014-05-20 DIAGNOSIS — E785 Hyperlipidemia, unspecified: Secondary | ICD-10-CM

## 2014-05-20 DIAGNOSIS — I2510 Atherosclerotic heart disease of native coronary artery without angina pectoris: Secondary | ICD-10-CM

## 2014-05-20 DIAGNOSIS — I6529 Occlusion and stenosis of unspecified carotid artery: Secondary | ICD-10-CM | POA: Insufficient documentation

## 2014-05-20 DIAGNOSIS — I2583 Coronary atherosclerosis due to lipid rich plaque: Principal | ICD-10-CM

## 2014-05-20 DIAGNOSIS — Z5189 Encounter for other specified aftercare: Secondary | ICD-10-CM | POA: Diagnosis not present

## 2014-05-20 NOTE — Patient Instructions (Signed)
Your physician has requested that you have an echocardiogram. Echocardiography is a painless test that uses sound waves to create images of your heart. It provides your doctor with information about the size and shape of your heart and how well your heart's chambers and valves are working. This procedure takes approximately one hour. There are no restrictions for this procedure.  Your physician wants you to follow-up in: 6 months with Dr. Radford Pax. You will receive a reminder letter in the mail two months in advance. If you don't receive a letter, please call our office to schedule the follow-up appointment.

## 2014-05-20 NOTE — Progress Notes (Signed)
Cardiology Office Note   Date:  05/20/2014   ID:  ARTIN MCEUEN, DOB 1950/06/01, MRN [id number]  PCP:  Dorian Heckle, MD  Cardiologist:   Sueanne Margarita, MD   Chief Complaint  Patient presents with  . Coronary Artery Disease  . Hypertension  . Hyperlipidemia      History of Present Illness: Travis Harrison is a 64 y.o. male with a hx of HTN, dyslipidemia, moderate carotid artery stenosis and ASCAD.  He was initially seen by me for crescendo angina and LHC was arranged and demonstrated severe LM stenosis and normal LVF. He was referred for CABG. CABG was done by Dr. Roxy Manns with a L-LAD and RIMA-OM1. Pre-op carotid US demonstrated RICA 39-03% and LICA 0-09%. Post op course was unremarkable. He remained in NSR. He returns for FU.  He denies significant dyspnea or anginal CP. Denies fever, cough. No orthopnea, PND, edema. No syncope, palpitations or dizziness. He is now in cardiac rehab and loving it.  He has been walking ~ 1 mile a day.      Past Medical History  Diagnosis Date  . Hyperlipidemia   . Hypertension   . 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 Carotid artery stenosis, asymptomatic     RICA 23-30% and LICA 0-76%     Past Surgical History  Procedure Laterality Date  . Coronary artery bypass graft N/A 03/04/2014    Procedure: CORONARY ARTERY BYPASS GRAFTING (CABG);  Surgeon: Rexene Alberts, MD;  Location: [place];  Service: Open Heart Surgery;  Laterality: N/A;  . Intraoperative transesophageal echocardiogram N/A 03/04/2014    Procedure: INTRAOPERATIVE TRANSESOPHAGEAL ECHOCARDIOGRAM;  Surgeon: Rexene Alberts, MD;  Location: [place];  Service: Open Heart Surgery;  Laterality: N/A;  . Left heart catheterization with coronary angiogram N/A 03/03/2014    Procedure: LEFT HEART CATHETERIZATION WITH CORONARY ANGIOGRAM;  Surgeon: Blane Ohara, MD;  Location: [facility] CATH LAB;  Service: Cardiovascular;  Laterality: N/A;  . Cardiac  catheterization       Current Outpatient Prescriptions  Medication Sig Dispense Refill  . aspirin 81 MG EC tablet Take 1 tablet (81 mg total) by mouth daily.    Marland Kitchen atorvastatin (LIPITOR) 40 MG tablet Take 1 tablet (40 mg total) by mouth daily. 90 tablet 3  . buPROPion (WELLBUTRIN SR) 100 MG 12 hr tablet Take 100 mg by mouth daily.    Marland Kitchen lisinopril (PRINIVIL,ZESTRIL) 10 MG tablet Take 1 tablet (10 mg total) by mouth daily. 30 tablet 1  . metoprolol tartrate (LOPRESSOR) 25 MG tablet Take 0.5 tablets (12.5 mg total) by mouth 2 (two) times daily. 90 tablet 3  . Omega-3 Fatty Acids (FISH OIL) 1000 MG CAPS Take 1 capsule by mouth daily.    Marland Kitchen oxyCODONE (OXY IR/ROXICODONE) 5 MG immediate release tablet Take 1-2 tablets (5-10 mg total) by mouth every 3 (three) hours as needed for severe pain. 30 tablet 0   Current Facility-Administered Medications  Medication Dose Route Frequency Provider Last Rate Last Dose  . nitroGLYCERIN (NITROSTAT) SL tablet 0.4 mg  0.4 mg Sublingual Q5 min PRN Sueanne Margarita, MD   0.4 mg at 03/03/14 1042    Allergies:   Review of patient's allergies indicates no known allergies.    Social History:  The patient  reports that he quit smoking about 15 years ago. He does not have any smokeless tobacco history on file. He reports that he drinks about 24.0  oz of alcohol per week.   Family History:  The patient's family history includes CVA in his father; Heart attack in his mother; Heart attack (age of onset: 19) in his father; Heart disease in his father and mother; Hypertension in his father.    ROS:  Please see the history of present illness.   Otherwise, review of systems are positive for none.   All other systems are reviewed and negative.    PHYSICAL EXAM: VS:  BP 120/88 mmHg  Pulse 77  Ht 5\' 8"  (1.727 m)  Wt 150 lb (68.04 kg)  BMI 22.81 kg/m2  SpO2 97% , BMI Body mass index is 22.81 kg/(m^2). GEN: Well nourished, well developed, in no acute distress HEENT:  normal Neck: no JVD, carotid bruits, or masses Cardiac: RRR; no rubs, or gallops,no edema 1/6 SM at RUSB Respiratory:  clear to auscultation bilaterally, normal work of breathing GI: soft, nontender, nondistended, + BS MS: no deformity or atrophy Skin: warm and dry, no rash Neuro:  Strength and sensation are intact Psych: euthymic mood, full affect   EKG:  EKG is not ordered today.    Recent Labs: 03/03/2014: TSH 1.860 03/05/2014: Magnesium 2.6* 03/07/2014: BUN 13; Creatinine 0.68; Hemoglobin 9.8*; Platelets 143*; Potassium 4.1; Sodium 137 05/18/2014: ALT 31    Lipid Panel    Component Value Date/Time   CHOL 169 05/18/2014 0912   TRIG 244.0* 05/18/2014 0912   HDL 48.00 05/18/2014 0912   CHOLHDL 4 05/18/2014 0912   VLDL 48.8* 05/18/2014 0912   LDLCALC 97 05/06/2014 1053   LDLDIRECT 87.0 05/18/2014 0912      Wt Readings from Last 3 Encounters:  05/20/14 150 lb (68.04 kg)  04/18/14 145 lb (65.772 kg)  04/12/14 144 lb 6.4 oz (65.5 kg)      Other studies Reviewed: Additional studies/ records that were reviewed today include: cardiac cath  ASSESSMENT AND PLAN:  1. Coronary Artery Disease: Doing well after recent CABG for severe LM disease. He has stopped smoking cigarettes.   - Continue ASA, statin, beta blocker. 2. Hypertension: Controlled. Continue BB 3. Hyperlipidemia: LDL 87   - Lipitor increased to 40 mg QD.  FLP and ALT pending in 6 weeks 4.  Moderate carotid artery stenosis - continue ASA.  Repeat carotid dopplers in 1 year 5.  Heart murmur - check 2D echo    Current medicines are reviewed at length with the patient today.  The patient does not have concerns regarding medicines.  The following changes have been made:  no change  Labs/ tests ordered today include: None     Disposition:   FU with me in 6 months   Signed, Sueanne Margarita, MD  05/20/2014 10:21 AM    [facility] [place], [place], [place]   [address] Phone: [phone]; Fax: [phone]

## 2014-05-23 ENCOUNTER — Encounter (HOSPITAL_COMMUNITY)
Admission: RE | Admit: 2014-05-23 | Discharge: 2014-05-23 | Disposition: A | Discharge status: Home / Self Care | Payer: BLUE CROSS/BLUE SHIELD | Source: Ambulatory Visit | Attending: Cardiology | Admitting: Cardiology

## 2014-05-23 DIAGNOSIS — Z951 Presence of aortocoronary bypass graft: Secondary | ICD-10-CM | POA: Insufficient documentation

## 2014-05-23 DIAGNOSIS — Z8249 Family history of ischemic heart disease and other diseases of the circulatory system: Secondary | ICD-10-CM | POA: Insufficient documentation

## 2014-05-23 DIAGNOSIS — E785 Hyperlipidemia, unspecified: Secondary | ICD-10-CM | POA: Diagnosis not present

## 2014-05-23 DIAGNOSIS — Z87891 Personal history of nicotine dependence: Secondary | ICD-10-CM | POA: Diagnosis not present

## 2014-05-23 DIAGNOSIS — I25110 Atherosclerotic heart disease of native coronary artery with unstable angina pectoris: Secondary | ICD-10-CM | POA: Insufficient documentation

## 2014-05-23 DIAGNOSIS — Z5189 Encounter for other specified aftercare: Secondary | ICD-10-CM | POA: Insufficient documentation

## 2014-05-23 DIAGNOSIS — Z7982 Long term (current) use of aspirin: Secondary | ICD-10-CM | POA: Diagnosis not present

## 2014-05-23 DIAGNOSIS — I77810 Thoracic aortic ectasia: Secondary | ICD-10-CM | POA: Insufficient documentation

## 2014-05-23 DIAGNOSIS — I10 Essential (primary) hypertension: Secondary | ICD-10-CM | POA: Diagnosis not present

## 2014-05-24 ENCOUNTER — Other Ambulatory Visit: Payer: Self-pay

## 2014-05-24 ENCOUNTER — Ambulatory Visit (HOSPITAL_COMMUNITY): Payer: BLUE CROSS/BLUE SHIELD | Attending: Cardiology | Admitting: Radiology

## 2014-05-24 DIAGNOSIS — I10 Essential (primary) hypertension: Secondary | ICD-10-CM | POA: Insufficient documentation

## 2014-05-24 DIAGNOSIS — I2510 Atherosclerotic heart disease of native coronary artery without angina pectoris: Secondary | ICD-10-CM | POA: Diagnosis not present

## 2014-05-24 DIAGNOSIS — E785 Hyperlipidemia, unspecified: Secondary | ICD-10-CM | POA: Diagnosis not present

## 2014-05-24 DIAGNOSIS — Z951 Presence of aortocoronary bypass graft: Secondary | ICD-10-CM

## 2014-05-24 DIAGNOSIS — I2583 Coronary atherosclerosis due to lipid rich plaque: Secondary | ICD-10-CM

## 2014-05-24 MED ORDER — METOPROLOL TARTRATE 25 MG PO TABS: 12.5000 mg | ORAL_TABLET | Freq: Two times a day (BID) | ORAL | Status: DC

## 2014-05-24 MED ORDER — ATORVASTATIN CALCIUM 40 MG PO TABS: 40.0000 mg | ORAL_TABLET | Freq: Every day | ORAL | Status: DC

## 2014-05-24 NOTE — Progress Notes (Signed)
Echocardiogram performed.  

## 2014-05-25 ENCOUNTER — Encounter (HOSPITAL_COMMUNITY)
Admission: RE | Admit: 2014-05-25 | Discharge: 2014-05-25 | Disposition: A | Discharge status: Home / Self Care | Payer: BLUE CROSS/BLUE SHIELD | Source: Ambulatory Visit | Attending: Cardiology | Admitting: Cardiology

## 2014-05-25 ENCOUNTER — Telehealth: Payer: Self-pay | Admitting: Cardiology

## 2014-05-25 ENCOUNTER — Telehealth: Payer: Self-pay

## 2014-05-25 DIAGNOSIS — I77810 Thoracic aortic ectasia: Secondary | ICD-10-CM

## 2014-05-25 DIAGNOSIS — Z5189 Encounter for other specified aftercare: Secondary | ICD-10-CM | POA: Diagnosis not present

## 2014-05-25 NOTE — Telephone Encounter (Signed)
-----   Message from Sueanne Margarita, MD sent at 05/24/2014  9:30 PM EST ----- Please let patient know that heart function is low normal with mildly leaky AR, mildly dilated aortic root and mild pulmonary HTN - repeat echo in 1 year

## 2014-05-25 NOTE — Telephone Encounter (Signed)
New Msg      [employer] calling,  Dental Consult was written/signed by Dr. Radford Pax but questions were not answered to approve clearance for pt to be seen.   Fax being sent today and they need to have it completed fully in order for pt to be seen.

## 2014-05-25 NOTE — Telephone Encounter (Signed)
Patient informed of results and verbal understanding expressed.  Repeat ECHO ordered to be scheduled in 1 year.  Patient agrees with treatment plan.

## 2014-05-26 NOTE — Telephone Encounter (Signed)
Informed [facility] that fax has not been received.  Provided fax [phone] to send again.

## 2014-05-27 ENCOUNTER — Encounter (HOSPITAL_COMMUNITY)
Admission: RE | Admit: 2014-05-27 | Discharge: 2014-05-27 | Disposition: A | Discharge status: Home / Self Care | Payer: BLUE CROSS/BLUE SHIELD | Source: Ambulatory Visit | Attending: Cardiology | Admitting: Cardiology

## 2014-05-27 DIAGNOSIS — Z5189 Encounter for other specified aftercare: Secondary | ICD-10-CM | POA: Diagnosis not present

## 2014-05-30 ENCOUNTER — Encounter (HOSPITAL_COMMUNITY)
Admission: RE | Admit: 2014-05-30 | Discharge: 2014-05-30 | Disposition: A | Discharge status: Home / Self Care | Payer: BLUE CROSS/BLUE SHIELD | Source: Ambulatory Visit | Attending: Cardiology | Admitting: Cardiology

## 2014-05-30 DIAGNOSIS — Z5189 Encounter for other specified aftercare: Secondary | ICD-10-CM | POA: Diagnosis not present

## 2014-05-30 NOTE — Telephone Encounter (Signed)
Per [facility] clinic protocol, 6 months should pass after a cardiac procedure before patient will be seen. He will be reevaluated after May 13.

## 2014-05-31 DIAGNOSIS — Z736 Limitation of activities due to disability: Secondary | ICD-10-CM

## 2014-06-01 ENCOUNTER — Encounter (HOSPITAL_COMMUNITY)
Admission: RE | Admit: 2014-06-01 | Discharge: 2014-06-01 | Disposition: A | Discharge status: Home / Self Care | Payer: BLUE CROSS/BLUE SHIELD | Source: Ambulatory Visit | Attending: Cardiology | Admitting: Cardiology

## 2014-06-01 ENCOUNTER — Encounter (HOSPITAL_COMMUNITY): Admission: RE | Admit: 2014-06-01 | Payer: BLUE CROSS/BLUE SHIELD | Source: Ambulatory Visit

## 2014-06-01 DIAGNOSIS — Z5189 Encounter for other specified aftercare: Secondary | ICD-10-CM | POA: Diagnosis not present

## 2014-06-03 ENCOUNTER — Encounter (HOSPITAL_COMMUNITY): Payer: BLUE CROSS/BLUE SHIELD

## 2014-06-03 ENCOUNTER — Encounter (HOSPITAL_COMMUNITY)
Admission: RE | Admit: 2014-06-03 | Discharge: 2014-06-03 | Disposition: A | Discharge status: Home / Self Care | Payer: BLUE CROSS/BLUE SHIELD | Source: Ambulatory Visit | Attending: Cardiology | Admitting: Cardiology

## 2014-06-03 DIAGNOSIS — Z5189 Encounter for other specified aftercare: Secondary | ICD-10-CM | POA: Diagnosis not present

## 2014-06-06 ENCOUNTER — Encounter (HOSPITAL_COMMUNITY): Payer: BLUE CROSS/BLUE SHIELD

## 2014-06-08 ENCOUNTER — Encounter (HOSPITAL_COMMUNITY): Payer: BLUE CROSS/BLUE SHIELD

## 2014-06-08 ENCOUNTER — Encounter (HOSPITAL_COMMUNITY)
Admission: RE | Admit: 2014-06-08 | Discharge: 2014-06-08 | Disposition: A | Discharge status: Home / Self Care | Payer: BLUE CROSS/BLUE SHIELD | Source: Ambulatory Visit | Attending: Cardiology | Admitting: Cardiology

## 2014-06-08 DIAGNOSIS — Z5189 Encounter for other specified aftercare: Secondary | ICD-10-CM | POA: Diagnosis not present

## 2014-06-10 ENCOUNTER — Encounter (HOSPITAL_COMMUNITY)
Admission: RE | Admit: 2014-06-10 | Discharge: 2014-06-10 | Disposition: A | Discharge status: Home / Self Care | Payer: BLUE CROSS/BLUE SHIELD | Source: Ambulatory Visit | Attending: Cardiology | Admitting: Cardiology

## 2014-06-10 ENCOUNTER — Encounter (HOSPITAL_COMMUNITY): Payer: BLUE CROSS/BLUE SHIELD

## 2014-06-10 DIAGNOSIS — Z5189 Encounter for other specified aftercare: Secondary | ICD-10-CM | POA: Diagnosis not present

## 2014-06-13 ENCOUNTER — Encounter (HOSPITAL_COMMUNITY)
Admission: RE | Admit: 2014-06-13 | Discharge: 2014-06-13 | Disposition: A | Discharge status: Home / Self Care | Payer: BLUE CROSS/BLUE SHIELD | Source: Ambulatory Visit | Attending: Cardiology | Admitting: Cardiology

## 2014-06-13 ENCOUNTER — Encounter (HOSPITAL_COMMUNITY): Payer: BLUE CROSS/BLUE SHIELD

## 2014-06-13 DIAGNOSIS — Z5189 Encounter for other specified aftercare: Secondary | ICD-10-CM | POA: Diagnosis not present

## 2014-06-15 ENCOUNTER — Encounter (HOSPITAL_COMMUNITY): Payer: BLUE CROSS/BLUE SHIELD

## 2014-06-16 ENCOUNTER — Other Ambulatory Visit: Payer: Self-pay

## 2014-06-16 MED ORDER — LISINOPRIL 10 MG PO TABS: 10.0000 mg | ORAL_TABLET | Freq: Every day | ORAL | Status: DC

## 2014-06-17 ENCOUNTER — Encounter (HOSPITAL_COMMUNITY): Payer: BLUE CROSS/BLUE SHIELD

## 2014-06-17 ENCOUNTER — Encounter (HOSPITAL_COMMUNITY)
Admission: RE | Admit: 2014-06-17 | Discharge: 2014-06-17 | Disposition: A | Discharge status: Home / Self Care | Payer: BLUE CROSS/BLUE SHIELD | Source: Ambulatory Visit | Attending: Cardiology | Admitting: Cardiology

## 2014-06-17 DIAGNOSIS — Z5189 Encounter for other specified aftercare: Secondary | ICD-10-CM | POA: Diagnosis not present

## 2014-06-20 ENCOUNTER — Encounter (HOSPITAL_COMMUNITY)
Admission: RE | Admit: 2014-06-20 | Discharge: 2014-06-20 | Disposition: A | Discharge status: Home / Self Care | Payer: BLUE CROSS/BLUE SHIELD | Source: Ambulatory Visit | Attending: Cardiology | Admitting: Cardiology

## 2014-06-20 ENCOUNTER — Encounter (HOSPITAL_COMMUNITY): Payer: BLUE CROSS/BLUE SHIELD

## 2014-06-20 DIAGNOSIS — Z5189 Encounter for other specified aftercare: Secondary | ICD-10-CM | POA: Diagnosis not present

## 2014-06-22 ENCOUNTER — Encounter (HOSPITAL_COMMUNITY)
Admission: RE | Admit: 2014-06-22 | Discharge: 2014-06-22 | Disposition: A | Discharge status: Home / Self Care | Payer: BLUE CROSS/BLUE SHIELD | Source: Ambulatory Visit | Attending: Cardiology | Admitting: Cardiology

## 2014-06-22 ENCOUNTER — Encounter (HOSPITAL_COMMUNITY): Payer: BLUE CROSS/BLUE SHIELD

## 2014-06-22 DIAGNOSIS — I25110 Atherosclerotic heart disease of native coronary artery with unstable angina pectoris: Secondary | ICD-10-CM | POA: Diagnosis not present

## 2014-06-22 DIAGNOSIS — Z8249 Family history of ischemic heart disease and other diseases of the circulatory system: Secondary | ICD-10-CM | POA: Diagnosis not present

## 2014-06-22 DIAGNOSIS — I10 Essential (primary) hypertension: Secondary | ICD-10-CM | POA: Insufficient documentation

## 2014-06-22 DIAGNOSIS — Z87891 Personal history of nicotine dependence: Secondary | ICD-10-CM | POA: Diagnosis not present

## 2014-06-22 DIAGNOSIS — Z5189 Encounter for other specified aftercare: Secondary | ICD-10-CM | POA: Diagnosis not present

## 2014-06-22 DIAGNOSIS — Z7982 Long term (current) use of aspirin: Secondary | ICD-10-CM | POA: Insufficient documentation

## 2014-06-22 DIAGNOSIS — I77810 Thoracic aortic ectasia: Secondary | ICD-10-CM | POA: Diagnosis not present

## 2014-06-22 DIAGNOSIS — E785 Hyperlipidemia, unspecified: Secondary | ICD-10-CM | POA: Insufficient documentation

## 2014-06-22 DIAGNOSIS — Z951 Presence of aortocoronary bypass graft: Secondary | ICD-10-CM | POA: Diagnosis not present

## 2014-06-24 ENCOUNTER — Encounter (HOSPITAL_COMMUNITY): Payer: BLUE CROSS/BLUE SHIELD

## 2014-06-24 ENCOUNTER — Encounter (HOSPITAL_COMMUNITY)
Admission: RE | Admit: 2014-06-24 | Discharge: 2014-06-24 | Disposition: A | Discharge status: Home / Self Care | Payer: BLUE CROSS/BLUE SHIELD | Source: Ambulatory Visit | Attending: Cardiology | Admitting: Cardiology

## 2014-06-24 DIAGNOSIS — Z5189 Encounter for other specified aftercare: Secondary | ICD-10-CM | POA: Diagnosis not present

## 2014-06-27 ENCOUNTER — Encounter (HOSPITAL_COMMUNITY)
Admission: RE | Admit: 2014-06-27 | Discharge: 2014-06-27 | Disposition: A | Discharge status: Home / Self Care | Payer: BLUE CROSS/BLUE SHIELD | Source: Ambulatory Visit | Attending: Cardiology | Admitting: Cardiology

## 2014-06-27 ENCOUNTER — Encounter (HOSPITAL_COMMUNITY): Payer: BLUE CROSS/BLUE SHIELD

## 2014-06-27 DIAGNOSIS — Z5189 Encounter for other specified aftercare: Secondary | ICD-10-CM | POA: Diagnosis not present

## 2014-06-29 ENCOUNTER — Encounter (HOSPITAL_COMMUNITY)
Admission: RE | Admit: 2014-06-29 | Discharge: 2014-06-29 | Disposition: A | Discharge status: Home / Self Care | Payer: BLUE CROSS/BLUE SHIELD | Source: Ambulatory Visit | Attending: Cardiology | Admitting: Cardiology

## 2014-06-29 ENCOUNTER — Encounter (HOSPITAL_COMMUNITY): Payer: BLUE CROSS/BLUE SHIELD

## 2014-06-29 DIAGNOSIS — Z5189 Encounter for other specified aftercare: Secondary | ICD-10-CM | POA: Diagnosis not present

## 2014-07-01 ENCOUNTER — Encounter (HOSPITAL_COMMUNITY)
Admission: RE | Admit: 2014-07-01 | Discharge: 2014-07-01 | Disposition: A | Discharge status: Home / Self Care | Payer: BLUE CROSS/BLUE SHIELD | Source: Ambulatory Visit | Attending: Cardiology | Admitting: Cardiology

## 2014-07-01 ENCOUNTER — Encounter (HOSPITAL_COMMUNITY): Payer: BLUE CROSS/BLUE SHIELD

## 2014-07-01 DIAGNOSIS — Z5189 Encounter for other specified aftercare: Secondary | ICD-10-CM | POA: Diagnosis not present

## 2014-07-04 ENCOUNTER — Encounter (HOSPITAL_COMMUNITY): Payer: BLUE CROSS/BLUE SHIELD

## 2014-07-04 ENCOUNTER — Encounter (HOSPITAL_COMMUNITY)
Admission: RE | Admit: 2014-07-04 | Discharge: 2014-07-04 | Disposition: A | Discharge status: Home / Self Care | Payer: BLUE CROSS/BLUE SHIELD | Source: Ambulatory Visit | Attending: Cardiology | Admitting: Cardiology

## 2014-07-04 DIAGNOSIS — Z5189 Encounter for other specified aftercare: Secondary | ICD-10-CM | POA: Diagnosis not present

## 2014-07-05 ENCOUNTER — Other Ambulatory Visit: Payer: Self-pay | Admitting: *Deleted

## 2014-07-05 ENCOUNTER — Other Ambulatory Visit (INDEPENDENT_AMBULATORY_CARE_PROVIDER_SITE_OTHER): Payer: BLUE CROSS/BLUE SHIELD | Admitting: *Deleted

## 2014-07-05 DIAGNOSIS — E785 Hyperlipidemia, unspecified: Secondary | ICD-10-CM

## 2014-07-05 LAB — LIPID PANEL
CHOL/HDL RATIO: 3
CHOLESTEROL: 129 mg/dL (ref 0–200)
HDL: 38.6 mg/dL — ABNORMAL LOW (ref >39.00)
LDL CALC: 79 mg/dL (ref 0–99)
NONHDL: 90.4
Triglycerides: 58 mg/dL (ref 0.0–149.0)
VLDL: 11.6 mg/dL (ref 0.0–40.0)

## 2014-07-05 LAB — HEPATIC FUNCTION PANEL
ALK PHOS: 53 U/L (ref 39–117)
ALT: 31 U/L (ref 0–53)
AST: 26 U/L (ref 0–37)
Albumin: 4.2 g/dL (ref 3.5–5.2)
Bilirubin, Direct: 0.1 mg/dL (ref 0.0–0.3)
Total Bilirubin: 0.4 mg/dL (ref 0.2–1.2)
Total Protein: 6.2 g/dL (ref 6.0–8.3)

## 2014-07-05 MED ORDER — ATORVASTATIN CALCIUM 80 MG PO TABS: 80.0000 mg | ORAL_TABLET | Freq: Every day | ORAL | Status: DC

## 2014-07-05 NOTE — Addendum Note (Signed)
Addended by: Eulis Foster on: 07/05/2014 07:39 AM   Modules accepted: Orders

## 2014-07-05 NOTE — Addendum Note (Signed)
Addended by: Thompson Grayer on: 07/05/2014 05:58 PM   Modules accepted: Orders

## 2014-07-06 ENCOUNTER — Encounter (HOSPITAL_COMMUNITY): Payer: BLUE CROSS/BLUE SHIELD

## 2014-07-08 ENCOUNTER — Encounter (HOSPITAL_COMMUNITY)
Admission: RE | Admit: 2014-07-08 | Discharge: 2014-07-08 | Disposition: A | Discharge status: Home / Self Care | Payer: BLUE CROSS/BLUE SHIELD | Source: Ambulatory Visit | Attending: Cardiology | Admitting: Cardiology

## 2014-07-08 ENCOUNTER — Encounter (HOSPITAL_COMMUNITY): Payer: BLUE CROSS/BLUE SHIELD

## 2014-07-08 DIAGNOSIS — Z5189 Encounter for other specified aftercare: Secondary | ICD-10-CM | POA: Diagnosis not present

## 2014-07-11 ENCOUNTER — Encounter (HOSPITAL_COMMUNITY): Payer: BLUE CROSS/BLUE SHIELD

## 2014-07-11 ENCOUNTER — Encounter (HOSPITAL_COMMUNITY)
Admission: RE | Admit: 2014-07-11 | Discharge: 2014-07-11 | Disposition: A | Discharge status: Home / Self Care | Payer: BLUE CROSS/BLUE SHIELD | Source: Ambulatory Visit | Attending: Cardiology | Admitting: Cardiology

## 2014-07-11 DIAGNOSIS — Z5189 Encounter for other specified aftercare: Secondary | ICD-10-CM | POA: Diagnosis not present

## 2014-07-11 NOTE — Progress Notes (Signed)
Travis Harrison 64 y.o. male Nutrition Note Spoke with pt. Nutrition Survey reviewed with pt. Pt is following Step 2 of the Therapeutic Lifestyle Changes diet. Pt reports he is currently not drinking beer. Pt reports having a difficult time drinking beer in moderation. The need to take a B-complex vitamin with folic acid discussed. Pt expressed understanding of the information reviewed. Pt aware of nutrition education classes offered and plans on attending nutrition classes.  Nutrition Diagnosis ? Food-and nutrition-related knowledge deficit related to lack of exposure to information as related to diagnosis of: ? CVD   Nutrition Intervention ? Benefits of adopting Therapeutic Lifestyle Changes discussed when Medficts reviewed. ? Pt to attend the Portion Distortion class - met; 05/25/14 ? Pt to attend the  ? Nutrition I class                     ? Nutrition II class ? Continue client-centered nutrition education by RD, as part of interdisciplinary care.  Goal(s) ? Pt to describe the benefit of including fruits, vegetables, whole grains, and low-fat dairy products in a heart healthy meal plan.  Monitor and Evaluate progress toward nutrition goal with team.  Derek Mound, M.Ed, RD, LDN, CDE 07/11/2014 8:04 AM

## 2014-07-13 ENCOUNTER — Encounter (HOSPITAL_COMMUNITY): Payer: BLUE CROSS/BLUE SHIELD

## 2014-07-13 ENCOUNTER — Encounter (HOSPITAL_COMMUNITY)
Admission: RE | Admit: 2014-07-13 | Discharge: 2014-07-13 | Disposition: A | Discharge status: Home / Self Care | Payer: BLUE CROSS/BLUE SHIELD | Source: Ambulatory Visit | Attending: Cardiology | Admitting: Cardiology

## 2014-07-13 DIAGNOSIS — Z5189 Encounter for other specified aftercare: Secondary | ICD-10-CM | POA: Diagnosis not present

## 2014-07-15 ENCOUNTER — Encounter (HOSPITAL_COMMUNITY): Payer: BLUE CROSS/BLUE SHIELD

## 2014-07-15 ENCOUNTER — Encounter (HOSPITAL_COMMUNITY)
Admission: RE | Admit: 2014-07-15 | Discharge: 2014-07-15 | Disposition: A | Discharge status: Home / Self Care | Payer: BLUE CROSS/BLUE SHIELD | Source: Ambulatory Visit | Attending: Cardiology | Admitting: Cardiology

## 2014-07-15 DIAGNOSIS — Z5189 Encounter for other specified aftercare: Secondary | ICD-10-CM | POA: Diagnosis not present

## 2014-07-18 ENCOUNTER — Encounter (HOSPITAL_COMMUNITY): Payer: BLUE CROSS/BLUE SHIELD

## 2014-07-18 ENCOUNTER — Encounter (HOSPITAL_COMMUNITY)
Admission: RE | Admit: 2014-07-18 | Discharge: 2014-07-18 | Disposition: A | Discharge status: Home / Self Care | Payer: BLUE CROSS/BLUE SHIELD | Source: Ambulatory Visit | Attending: Cardiology | Admitting: Cardiology

## 2014-07-18 DIAGNOSIS — Z5189 Encounter for other specified aftercare: Secondary | ICD-10-CM | POA: Diagnosis not present

## 2014-07-20 ENCOUNTER — Encounter (HOSPITAL_COMMUNITY): Payer: BLUE CROSS/BLUE SHIELD

## 2014-07-22 ENCOUNTER — Encounter (HOSPITAL_COMMUNITY): Payer: BLUE CROSS/BLUE SHIELD

## 2014-07-22 ENCOUNTER — Encounter (HOSPITAL_COMMUNITY)
Admission: RE | Admit: 2014-07-22 | Discharge: 2014-07-22 | Disposition: A | Discharge status: Home / Self Care | Payer: BLUE CROSS/BLUE SHIELD | Source: Ambulatory Visit | Attending: Cardiology | Admitting: Cardiology

## 2014-07-22 DIAGNOSIS — Z5189 Encounter for other specified aftercare: Secondary | ICD-10-CM | POA: Diagnosis not present

## 2014-07-22 DIAGNOSIS — I10 Essential (primary) hypertension: Secondary | ICD-10-CM | POA: Insufficient documentation

## 2014-07-22 DIAGNOSIS — E785 Hyperlipidemia, unspecified: Secondary | ICD-10-CM | POA: Insufficient documentation

## 2014-07-22 DIAGNOSIS — Z8249 Family history of ischemic heart disease and other diseases of the circulatory system: Secondary | ICD-10-CM | POA: Diagnosis not present

## 2014-07-22 DIAGNOSIS — Z7982 Long term (current) use of aspirin: Secondary | ICD-10-CM | POA: Insufficient documentation

## 2014-07-22 DIAGNOSIS — Z951 Presence of aortocoronary bypass graft: Secondary | ICD-10-CM | POA: Diagnosis not present

## 2014-07-22 DIAGNOSIS — Z87891 Personal history of nicotine dependence: Secondary | ICD-10-CM | POA: Diagnosis not present

## 2014-07-22 DIAGNOSIS — I77810 Thoracic aortic ectasia: Secondary | ICD-10-CM | POA: Diagnosis not present

## 2014-07-22 DIAGNOSIS — I25110 Atherosclerotic heart disease of native coronary artery with unstable angina pectoris: Secondary | ICD-10-CM | POA: Insufficient documentation

## 2014-07-25 ENCOUNTER — Encounter (HOSPITAL_COMMUNITY)
Admission: RE | Admit: 2014-07-25 | Discharge: 2014-07-25 | Disposition: A | Discharge status: Home / Self Care | Payer: BLUE CROSS/BLUE SHIELD | Source: Ambulatory Visit | Attending: Cardiology | Admitting: Cardiology

## 2014-07-25 DIAGNOSIS — Z5189 Encounter for other specified aftercare: Secondary | ICD-10-CM | POA: Diagnosis not present

## 2014-07-27 ENCOUNTER — Encounter (HOSPITAL_COMMUNITY)
Admission: RE | Admit: 2014-07-27 | Discharge: 2014-07-27 | Disposition: A | Discharge status: Home / Self Care | Payer: BLUE CROSS/BLUE SHIELD | Source: Ambulatory Visit | Attending: Cardiology | Admitting: Cardiology

## 2014-07-27 DIAGNOSIS — Z5189 Encounter for other specified aftercare: Secondary | ICD-10-CM | POA: Diagnosis not present

## 2014-07-29 ENCOUNTER — Encounter (HOSPITAL_COMMUNITY)
Admission: RE | Admit: 2014-07-29 | Discharge: 2014-07-29 | Disposition: A | Discharge status: Home / Self Care | Payer: BLUE CROSS/BLUE SHIELD | Source: Ambulatory Visit | Attending: Cardiology | Admitting: Cardiology

## 2014-07-29 DIAGNOSIS — Z5189 Encounter for other specified aftercare: Secondary | ICD-10-CM | POA: Diagnosis not present

## 2014-08-01 ENCOUNTER — Encounter (HOSPITAL_COMMUNITY)
Admission: RE | Admit: 2014-08-01 | Discharge: 2014-08-01 | Disposition: A | Discharge status: Home / Self Care | Payer: BLUE CROSS/BLUE SHIELD | Source: Ambulatory Visit | Attending: Cardiology | Admitting: Cardiology

## 2014-08-01 DIAGNOSIS — Z5189 Encounter for other specified aftercare: Secondary | ICD-10-CM | POA: Diagnosis not present

## 2014-08-01 NOTE — Progress Notes (Signed)
Pt graduates today from the cardiac rehab program with the completion of 36 exercise classes.  Pt maintained excellent attendance to both education and exercise sessions.  Pt made excellent progress as evident by his met level progression from 3.6 to 6.4 over 14 week period! Pt plans to continue exercise with walking on trail that is located near his home and is a level and safe area.  Pt will do well with this due to his commitment to exercise on his off days.  Medication list reconciled.  Pt continue compliance to medications. Psychological Assessment - Repeat PHQ2 score - 0.  Pt denies any depression.  No needs identified.  No intervention required.  Pt has the support of his wife who he rated a "7" on a 1-6 scale for being supportive.  Pt met his short term goal for expanding breathing.  Pt can breathe much deeper since he start using the arm ergometer.  Pt feels he is much healthier by  attending the cardiac rehab program.  Pt plans to attend the remaining nutrition class on tomorrow.  It was indeed a pleasure to have him participate in cardiac rehab program his smile will be missed. Cherre Huger, BSN

## 2014-08-02 ENCOUNTER — Other Ambulatory Visit: Payer: BLUE CROSS/BLUE SHIELD

## 2014-08-03 ENCOUNTER — Encounter (HOSPITAL_COMMUNITY): Payer: BLUE CROSS/BLUE SHIELD

## 2014-08-05 ENCOUNTER — Encounter (HOSPITAL_COMMUNITY): Payer: BLUE CROSS/BLUE SHIELD

## 2014-08-08 ENCOUNTER — Encounter (HOSPITAL_COMMUNITY): Payer: BLUE CROSS/BLUE SHIELD

## 2014-08-10 ENCOUNTER — Telehealth: Payer: Self-pay

## 2014-08-10 ENCOUNTER — Encounter (HOSPITAL_COMMUNITY): Payer: BLUE CROSS/BLUE SHIELD

## 2014-08-10 NOTE — Telephone Encounter (Addendum)
Sent patient MyChart message with instructions.

## 2014-08-10 NOTE — Telephone Encounter (Signed)
OK to use Viagra but instruct patient not to use any Nitroglycerin within 48 hours of taking

## 2014-08-10 NOTE — Telephone Encounter (Signed)
Patient's wife asking if it is OK for patient to use Viagra. She says he already has a prescription for it, they are just nervous to start.  To Dr. Radford Pax.

## 2014-08-12 ENCOUNTER — Encounter (HOSPITAL_COMMUNITY): Payer: BLUE CROSS/BLUE SHIELD

## 2014-08-15 ENCOUNTER — Encounter: Payer: Self-pay | Admitting: Cardiology

## 2014-08-15 ENCOUNTER — Encounter (HOSPITAL_COMMUNITY): Payer: BLUE CROSS/BLUE SHIELD

## 2014-08-17 ENCOUNTER — Encounter (HOSPITAL_COMMUNITY): Payer: BLUE CROSS/BLUE SHIELD

## 2014-08-19 ENCOUNTER — Encounter (HOSPITAL_COMMUNITY): Payer: BLUE CROSS/BLUE SHIELD

## 2014-09-15 ENCOUNTER — Telehealth: Payer: Self-pay | Admitting: Cardiology

## 2014-09-15 NOTE — Telephone Encounter (Signed)
Walk in pt form-Labs-dropped off gave to [facility]

## 2014-09-22 ENCOUNTER — Encounter: Payer: Self-pay | Admitting: Cardiology

## 2014-09-27 ENCOUNTER — Other Ambulatory Visit: Payer: BLUE CROSS/BLUE SHIELD

## 2014-09-30 ENCOUNTER — Telehealth: Payer: Self-pay

## 2014-09-30 DIAGNOSIS — E785 Hyperlipidemia, unspecified: Secondary | ICD-10-CM

## 2014-09-30 NOTE — Telephone Encounter (Signed)
Spoke with patient's wife who agrees with referral to [facility]. Referral placed and message sent to scheduling.

## 2014-09-30 NOTE — Telephone Encounter (Signed)
-----   Message from Sueanne Margarita, MD sent at 09/22/2014  6:29 PM EDT ----- LDL not at goal but AST and ALT are elevated - refer to lipid clinic

## 2014-10-18 ENCOUNTER — Ambulatory Visit: Payer: BLUE CROSS/BLUE SHIELD | Admitting: Pharmacist

## 2014-10-18 ENCOUNTER — Ambulatory Visit (INDEPENDENT_AMBULATORY_CARE_PROVIDER_SITE_OTHER): Payer: BLUE CROSS/BLUE SHIELD | Admitting: Cardiology

## 2014-10-18 ENCOUNTER — Encounter: Payer: Self-pay | Admitting: Cardiology

## 2014-10-18 VITALS — BP 100/70 | HR 61 | Ht 68.0 in | Wt 152.2 lb

## 2014-10-18 DIAGNOSIS — I77810 Thoracic aortic ectasia: Secondary | ICD-10-CM

## 2014-10-18 DIAGNOSIS — I10 Essential (primary) hypertension: Secondary | ICD-10-CM

## 2014-10-18 DIAGNOSIS — Z951 Presence of aortocoronary bypass graft: Secondary | ICD-10-CM

## 2014-10-18 DIAGNOSIS — I2510 Atherosclerotic heart disease of native coronary artery without angina pectoris: Secondary | ICD-10-CM

## 2014-10-18 DIAGNOSIS — I6523 Occlusion and stenosis of bilateral carotid arteries: Secondary | ICD-10-CM | POA: Diagnosis not present

## 2014-10-18 DIAGNOSIS — E785 Hyperlipidemia, unspecified: Secondary | ICD-10-CM

## 2014-10-18 NOTE — Progress Notes (Signed)
Cardiology Office Note   Date:  10/18/2014   ID:  Travis Harrison, DOB 1951/04/17, MRN [id number]  PCP:  Dorian Heckle, MD    Chief Complaint  Patient presents with  . Follow-up    CAD      History of Present Illness: Travis Harrison is a 64 y.o. male with a hx of HTN, dyslipidemia, moderate carotid artery stenosis and ASCAD s/p CABG by Dr. Roxy Manns with a L-LAD and RIMA-OM1. Pre-op carotid US demonstrated RICA 20-25% and LICA 4-27%.  He returns for FU. He denies significant dyspnea or anginal CP.  No orthopnea, PND, edema. No syncope, palpitations or dizziness.  He has been walking ~ 2 miles 3-4 times weekly.        Past Medical History  Diagnosis Date  . Hyperlipidemia   . Hypertension   . 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 Carotid artery stenosis, asymptomatic     RICA 06-23% and LICA 7-62%     Past Surgical History  Procedure Laterality Date  . Coronary artery bypass graft N/A 03/04/2014    Procedure: CORONARY ARTERY BYPASS GRAFTING (CABG);  Surgeon: Rexene Alberts, MD;  Location: [place];  Service: Open Heart Surgery;  Laterality: N/A;  . Intraoperative transesophageal echocardiogram N/A 03/04/2014    Procedure: INTRAOPERATIVE TRANSESOPHAGEAL ECHOCARDIOGRAM;  Surgeon: Rexene Alberts, MD;  Location: [place];  Service: Open Heart Surgery;  Laterality: N/A;  . Left heart catheterization with coronary angiogram N/A 03/03/2014    Procedure: LEFT HEART CATHETERIZATION WITH CORONARY ANGIOGRAM;  Surgeon: Blane Ohara, MD;  Location: [facility] CATH LAB;  Service: Cardiovascular;  Laterality: N/A;  . Cardiac catheterization       Current Outpatient Prescriptions  Medication Sig Dispense Refill  . aspirin 81 MG EC tablet Take 1 tablet (81 mg total) by mouth daily.    Marland Kitchen atorvastatin (LIPITOR) 80 MG tablet Take 1 tablet (80 mg total) by mouth daily. 90 tablet 3  . buPROPion (WELLBUTRIN SR) 100 MG 12 hr tablet Take 100 mg by mouth  daily.    Marland Kitchen lisinopril (PRINIVIL,ZESTRIL) 10 MG tablet Take 1 tablet (10 mg total) by mouth daily. 90 tablet 1  . metoprolol tartrate (LOPRESSOR) 25 MG tablet Take 0.5 tablets (12.5 mg total) by mouth 2 (two) times daily. 90 tablet 3  . Omega-3 Fatty Acids (FISH OIL) 1000 MG CAPS Take 1 capsule by mouth daily.    Marland Kitchen oxyCODONE (OXY IR/ROXICODONE) 5 MG immediate release tablet Take 1-2 tablets (5-10 mg total) by mouth every 3 (three) hours as needed for severe pain. 30 tablet 0   No current facility-administered medications for this visit.    Allergies:   Review of patient's allergies indicates no known allergies.    Social History:  The patient  reports that he quit smoking about 15 years ago. He does not have any smokeless tobacco history on file. He reports that he drinks about 24.0 oz of alcohol per week.   Family History:  The patient's family history includes CVA in his father; Heart attack in his mother; Heart attack (age of onset: 75) in his father; Heart disease in his father and mother; Hypertension in his father.    ROS:  Please see the history of present illness.   Otherwise, review of systems are positive for none.   All other systems are  reviewed and negative.    PHYSICAL EXAM: VS:  BP 100/70 mmHg  Pulse 61  Ht 5\' 8"  (1.727 m)  Wt 152 lb 3.2 oz (69.037 kg)  BMI 23.15 kg/m2  SpO2 94% , BMI Body mass index is 23.15 kg/(m^2). GEN: Well nourished, well developed, in no acute distress HEENT: normal Neck: no JVD, carotid bruits, or masses Cardiac: RRR; no murmurs, rubs, or gallops,no edema  Respiratory:  clear to auscultation bilaterally, normal work of breathing GI: soft, nontender, nondistended, + BS MS: no deformity or atrophy Skin: warm and dry, no rash Neuro:  Strength and sensation are intact Psych: euthymic mood, full affect   EKG:  EKG is not ordered today.    Recent Labs: 03/03/2014: TSH 1.860 03/05/2014: Magnesium 2.6* 03/07/2014: BUN 13; Creatinine, Ser  0.68; Hemoglobin 9.8*; Platelets 143*; Potassium 4.1; Sodium 137 07/05/2014: ALT 31    Lipid Panel    Component Value Date/Time   CHOL 129 07/05/2014 0739   TRIG 58.0 07/05/2014 0739   HDL 38.60* 07/05/2014 0739   CHOLHDL 3 07/05/2014 0739   VLDL 11.6 07/05/2014 0739   LDLCALC 79 07/05/2014 0739   LDLDIRECT 87.0 05/18/2014 0912      Wt Readings from Last 3 Encounters:  10/18/14 152 lb 3.2 oz (69.037 kg)  05/20/14 150 lb (68.04 kg)  04/18/14 145 lb (65.772 kg)     ASSESSMENT AND PLAN:  1. Coronary Artery Disease: He has stopped smoking cigarettes. He has no angina  - Continue ASA, statin, beta blocker. 2. Hypertension: Controlled. Continue BB/ACE I 3. Hyperlipidemia: LDL 79  - continue Lipitor 80mg  daily and recheck FLP and ALT 4. Moderate carotid artery stenosis 1-39% left ICA and 40-59% right ICA stenosis - continue ASA. Repeat carotid dopplers 02/2015 5. Heart murmur - 2D echo with mild AR 6.  Mildly dilated aortic root - follow with yearly echo 7.  Mild pulmonary HTN - follow with yearly echo  Current medicines are reviewed at length with the patient today.  The patient does not have concerns regarding medicines.  The following changes have been made:  no change  Labs/ tests ordered today: See above Assessment and Plan No orders of the defined types were placed in this encounter.     Disposition:   FU with me in 6 months  Signed, Sueanne Margarita, MD  10/18/2014 3:06 PM    [facility] [place], [place], [place]  [address] Phone: [phone]; Fax: [phone]

## 2014-10-18 NOTE — Patient Instructions (Signed)
Medication Instructions:  Your physician recommends that you continue on your current medications as directed. Please refer to the Current Medication list given to you today.   Labwork: WITHIN ONE WEEK: BMET, LFTs, Lipids  Testing/Procedures: Your physician has requested that you have an echocardiogram in February, 2017. Echocardiography is a painless test that uses sound waves to create images of your heart. It provides your doctor with information about the size and shape of your heart and how well your heart's chambers and valves are working. This procedure takes approximately one hour. There are no restrictions for this procedure.  Your physician has requested that you have a carotid duplex in November, 2016. This test is an ultrasound of the carotid arteries in your neck. It looks at blood flow through these arteries that supply the brain with blood. Allow one hour for this exam. There are no restrictions or special instructions.  Follow-Up: Your physician wants you to follow-up in: 6 months with Dr. Radford Pax. You will receive a reminder letter in the mail two months in advance. If you don't receive a letter, please call our office to schedule the follow-up appointment.   Any Other Special Instructions Will Be Listed Below (If Applicable).

## 2014-10-27 ENCOUNTER — Other Ambulatory Visit (INDEPENDENT_AMBULATORY_CARE_PROVIDER_SITE_OTHER): Payer: BLUE CROSS/BLUE SHIELD | Admitting: *Deleted

## 2014-10-27 ENCOUNTER — Other Ambulatory Visit: Payer: BLUE CROSS/BLUE SHIELD

## 2014-10-27 ENCOUNTER — Telehealth: Payer: Self-pay | Admitting: *Deleted

## 2014-10-27 DIAGNOSIS — I10 Essential (primary) hypertension: Secondary | ICD-10-CM

## 2014-10-27 DIAGNOSIS — E785 Hyperlipidemia, unspecified: Secondary | ICD-10-CM

## 2014-10-27 LAB — LIPID PANEL
CHOLESTEROL: 161 mg/dL (ref 0–200)
HDL: 57.4 mg/dL (ref >39.00)
LDL Cholesterol: 87 mg/dL (ref 0–99)
NonHDL: 103.6
Total CHOL/HDL Ratio: 3
Triglycerides: 82 mg/dL (ref 0.0–149.0)
VLDL: 16.4 mg/dL (ref 0.0–40.0)

## 2014-10-27 LAB — BASIC METABOLIC PANEL
BUN: 21 mg/dL (ref 6–23)
CO2: 27 meq/L (ref 19–32)
Calcium: 9.1 mg/dL (ref 8.4–10.5)
Chloride: 102 mEq/L (ref 96–112)
Creatinine, Ser: 0.87 mg/dL (ref 0.40–1.50)
GFR: 93.33 mL/min (ref >60.00)
GLUCOSE: 99 mg/dL (ref 70–99)
POTASSIUM: 4.3 meq/L (ref 3.5–5.1)
SODIUM: 137 meq/L (ref 135–145)

## 2014-10-27 LAB — HEPATIC FUNCTION PANEL
ALK PHOS: 727 U/L — AB (ref 39–117)
ALT: 207 U/L — AB (ref 0–53)
AST: 213 U/L — AB (ref 0–37)
Albumin: 3.8 g/dL (ref 3.5–5.2)
BILIRUBIN DIRECT: 0.1 mg/dL (ref 0.0–0.3)
TOTAL PROTEIN: 6.7 g/dL (ref 6.0–8.3)
Total Bilirubin: 0.5 mg/dL (ref 0.2–1.2)

## 2014-10-27 NOTE — Telephone Encounter (Signed)
Spoke with Cecilie Kicks NP, pt to stop lipitor and follow up soon with pcp, spoke with wife, pt was there and gave request to give results. Labs reviewed, questions asked and answered.

## 2014-10-30 NOTE — Telephone Encounter (Signed)
Please refer patient to [facility] for further evaluation of elevated LFTs- ? Fatty liver.  He may be able to restart statin if this is due to fatty liver.

## 2014-11-02 NOTE — Telephone Encounter (Signed)
Left message to call back  

## 2014-11-04 NOTE — Telephone Encounter (Signed)
MyChart message sent to patient to [facility] GI referral.  Awaiting response.

## 2014-11-24 NOTE — Telephone Encounter (Signed)
Spoke with patient's wife, who st pt had an OV with Dr. Lysle Rubens yesterday and lab work was drawn. Patient's wife st they want to hold off on GI referral until Dr. Radford Pax reviews lab work.  Lab work requested from Dr. Glenna Durand office.

## 2014-11-25 ENCOUNTER — Encounter: Payer: Self-pay | Admitting: Cardiology

## 2015-01-16 ENCOUNTER — Other Ambulatory Visit: Payer: Self-pay

## 2015-01-16 MED ORDER — LISINOPRIL 10 MG PO TABS: 10.0000 mg | ORAL_TABLET | Freq: Every day | ORAL | Status: DC

## 2015-01-16 NOTE — Telephone Encounter (Signed)
Sueanne Margarita, MD at 10/18/2014 3:06 PM  lisinopril (PRINIVIL,ZESTRIL) 10 MG tabletTake 1 tablet (10 mg total) by mouth daily Medication Instructions:  Your physician recommends that you continue on your current medications as directed. Please refer to the Current Medication list given to you today.

## 2015-01-18 ENCOUNTER — Encounter: Payer: Self-pay | Admitting: Cardiology

## 2015-02-22 ENCOUNTER — Ambulatory Visit (HOSPITAL_COMMUNITY)
Admission: RE | Admit: 2015-02-22 | Discharge: 2015-02-22 | Disposition: A | Discharge status: Home / Self Care | Payer: BLUE CROSS/BLUE SHIELD | Source: Ambulatory Visit | Attending: Internal Medicine | Admitting: Internal Medicine

## 2015-02-22 DIAGNOSIS — I6523 Occlusion and stenosis of bilateral carotid arteries: Secondary | ICD-10-CM | POA: Diagnosis not present

## 2015-02-22 DIAGNOSIS — E785 Hyperlipidemia, unspecified: Secondary | ICD-10-CM | POA: Diagnosis not present

## 2015-02-22 DIAGNOSIS — I10 Essential (primary) hypertension: Secondary | ICD-10-CM | POA: Diagnosis not present

## 2015-02-23 ENCOUNTER — Telehealth: Payer: Self-pay

## 2015-02-23 DIAGNOSIS — I6529 Occlusion and stenosis of unspecified carotid artery: Secondary | ICD-10-CM

## 2015-02-23 NOTE — Telephone Encounter (Signed)
Informed patient's DPR of results and verbal understanding expressed.   Repeat study ordered to be scheduled in one year. DPR agrees with treatment plan.

## 2015-02-23 NOTE — Telephone Encounter (Signed)
-----   Message from Sueanne Margarita, MD sent at 02/23/2015 12:42 PM EDT ----- Minimal plaque, bilaterally.  Stable, 40-59% RICA stenosis.  Stable 2-44% LICA stenosis.  Patent vertebral arteries with antegrade flow.  Normal subclavian arteries, bilaterally.  Repeat study in 1 year

## 2015-03-20 ENCOUNTER — Encounter: Payer: Self-pay | Admitting: Thoracic Surgery (Cardiothoracic Vascular Surgery)

## 2015-03-20 ENCOUNTER — Ambulatory Visit (INDEPENDENT_AMBULATORY_CARE_PROVIDER_SITE_OTHER): Payer: BLUE CROSS/BLUE SHIELD | Admitting: Thoracic Surgery (Cardiothoracic Vascular Surgery)

## 2015-03-20 VITALS — BP 162/90 | HR 57 | Resp 16 | Ht 68.0 in | Wt 155.0 lb

## 2015-03-20 DIAGNOSIS — Z951 Presence of aortocoronary bypass graft: Secondary | ICD-10-CM | POA: Diagnosis not present

## 2015-03-20 NOTE — Patient Instructions (Signed)
Continue all previous medications without any changes at this time  

## 2015-03-20 NOTE — Progress Notes (Signed)
      [address]       [place] [address]             [phone]     CARDIOTHORACIC SURGERY OFFICE NOTE  Referring Provider is Sueanne Margarita, MD PCP is Dorian Heckle, MD   HPI:  Patient returns for routine follow-up status post coronary artery bypass grafting 2 on 03/04/2014 for severe left main coronary artery disease with unstable angina. His postoperative recovery was uncomplicated and he was last seen here in follow-up on 04/18/2014. Since then he has continued to do very well. He was seen in follow-up by Dr. Radford Pax on 10/18/2014 and he returns to our office for routine follow-up today. He reports feeling exceptionally well. He has no limitations whatsoever. He denies any symptoms of exertional shortness of breath or chest discomfort with any level of physical activity. His activity level is quite good and he feels much improved in comparison with how he felt prior to surgery.  He has been taken off of statins at least temporarily because of elevation in his liver enzymes.  Current Outpatient Prescriptions  Medication Sig Dispense Refill  . aspirin 81 MG EC tablet Take 1 tablet (81 mg total) by mouth daily.    Marland Kitchen buPROPion (WELLBUTRIN SR) 100 MG 12 hr tablet Take 100 mg by mouth daily.    Marland Kitchen lisinopril (PRINIVIL,ZESTRIL) 10 MG tablet Take 1 tablet (10 mg total) by mouth daily. 90 tablet 1  . metoprolol tartrate (LOPRESSOR) 25 MG tablet Take 0.5 tablets (12.5 mg total) by mouth 2 (two) times daily. 90 tablet 3  . Omega-3 Fatty Acids (FISH OIL) 1000 MG CAPS Take 1 capsule by mouth daily.     No current facility-administered medications for this visit.      Physical Exam:   BP 162/90 mmHg  Pulse 57  Resp 16  Ht 5\' 8"  (1.727 m)  Wt 155 lb (70.308 kg)  BMI 23.57 kg/m2  SpO2 96%  General:  Well-appearing  Chest:   Clear to auscultation  CV:   Regular rate and rhythm without murmur  Incisions:  Completely healed, sternum is stable  Abdomen:  Soft and  nontender  Extremities:  Warm and well-perfused  Diagnostic Tests:  n/a   Impression:  Patient is doing exceptionally well up proximally one year status post coronary artery bypass grafting.  Plan:  I have encouraged the patient to continue to maintain a heart healthy diet and exercise on a regular basis.  I have not recommended any changes to his medications at this time. The impact that keeping an eye on his lipid management could make in his long-term prognosis has been emphasized. All of his questions have been addressed. In the future he will call and return to see [place] as needed.  I spent in excess of 15 minutes during the conduct of this office consultation and >50% of this time involved direct face-to-face encounter with the patient for counseling and/or coordination of their care.   Valentina Gu. Roxy Manns, MD 03/20/2015 6:05 PM

## 2015-04-26 ENCOUNTER — Encounter: Payer: Self-pay | Admitting: Cardiology

## 2015-04-26 ENCOUNTER — Ambulatory Visit (INDEPENDENT_AMBULATORY_CARE_PROVIDER_SITE_OTHER): Payer: BLUE CROSS/BLUE SHIELD | Admitting: Cardiology

## 2015-04-26 VITALS — BP 140/84 | HR 60 | Ht 69.0 in | Wt 156.0 lb

## 2015-04-26 DIAGNOSIS — I10 Essential (primary) hypertension: Secondary | ICD-10-CM | POA: Diagnosis not present

## 2015-04-26 DIAGNOSIS — I2510 Atherosclerotic heart disease of native coronary artery without angina pectoris: Secondary | ICD-10-CM | POA: Diagnosis not present

## 2015-04-26 DIAGNOSIS — I6523 Occlusion and stenosis of bilateral carotid arteries: Secondary | ICD-10-CM

## 2015-04-26 DIAGNOSIS — E785 Hyperlipidemia, unspecified: Secondary | ICD-10-CM | POA: Diagnosis not present

## 2015-04-26 DIAGNOSIS — I2583 Coronary atherosclerosis due to lipid rich plaque: Principal | ICD-10-CM

## 2015-04-26 NOTE — Patient Instructions (Signed)
Medication Instructions:  Your physician recommends that you continue on your current medications as directed. Please refer to the Current Medication list given to you today.   Labwork: None  Testing/Procedures: You have an ECHO scheduled 05/30/15.  Follow-Up: You have been referred to LIPID CLINIC.  Your physician wants you to follow-up in: 1 year with Dr. Radford Pax. You will receive a reminder letter in the mail two months in advance. If you don't receive a letter, please call our office to schedule the follow-up appointment.   Any Other Special Instructions Will Be Listed Below (If Applicable).     If you need a refill on your cardiac medications before your next appointment, please call your pharmacy.

## 2015-04-26 NOTE — Progress Notes (Addendum)
Cardiology Office Note   Date:  04/26/2015   ID:  Travis Harrison, DOB 1950-10-26, MRN [id number]  PCP:  Wenda Low, MD    Chief Complaint  Patient presents with  . Coronary Artery Disease  . Hypertension      History of Present Illness: Travis Harrison is a 65 y.o. male with a hx of HTN, dyslipidemia, moderate carotid artery stenosis and ASCAD s/p CABG by Dr. Roxy Manns with a L-LAD and RIMA-OM1. Pre-op carotid US demonstrated RICA 123456 and LICA 123456. He returns for FU. He denies significant dyspnea or anginal CP.  No orthopnea, PND, edema. No syncope, palpitations or dizziness.  His walking as decreased due to cold weather.  He was on a statin but got elevated LFTs and this was stopped.    Past Medical History  Diagnosis Date  . Hyperlipidemia   . Hypertension   . 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 Carotid artery stenosis, asymptomatic     RICA 123456 and LICA 123456     Past Surgical History  Procedure Laterality Date  . Coronary artery bypass graft N/A 03/04/2014    Procedure: CORONARY ARTERY BYPASS GRAFTING (CABG);  Surgeon: Rexene Alberts, MD;  Location: [facility];  Service: Open Heart Surgery;  Laterality: N/A;  . Intraoperative transesophageal echocardiogram N/A 03/04/2014    Procedure: INTRAOPERATIVE TRANSESOPHAGEAL ECHOCARDIOGRAM;  Surgeon: Rexene Alberts, MD;  Location: [facility];  Service: Open Heart Surgery;  Laterality: N/A;  . Left heart catheterization with coronary angiogram N/A 03/03/2014    Procedure: LEFT HEART CATHETERIZATION WITH CORONARY ANGIOGRAM;  Surgeon: Blane Ohara, MD;  Location: [facility] CATH LAB;  Service: Cardiovascular;  Laterality: N/A;  . Cardiac catheterization       Current Outpatient Prescriptions  Medication Sig Dispense Refill  . aspirin 81 MG EC tablet Take 1 tablet (81 mg total) by mouth daily.    Marland Kitchen buPROPion (WELLBUTRIN SR) 100 MG 12 hr tablet Take 100 mg by mouth daily.    Marland Kitchen  lisinopril (PRINIVIL,ZESTRIL) 10 MG tablet Take 1 tablet (10 mg total) by mouth daily. 90 tablet 1  . metoprolol tartrate (LOPRESSOR) 25 MG tablet Take 0.5 tablets (12.5 mg total) by mouth 2 (two) times daily. 90 tablet 3  . Omega-3 Fatty Acids (FISH OIL) 1000 MG CAPS Take 1 capsule by mouth daily.     No current facility-administered medications for this visit.    Allergies:   Review of patient's allergies indicates no known allergies.    Social History:  The patient  reports that he quit smoking about 16 years ago. He does not have any smokeless tobacco history on file. He reports that he drinks about 24.0 oz of alcohol per week.   Family History:  The patient's family history includes CVA in his father; Heart attack in his mother; Heart attack (age of onset: 80) in his father; Heart disease in his father and mother; Hypertension in his father.    ROS:  Please see the history of present illness.   Otherwise, review of systems are positive for none.   All other systems are reviewed and negative.    PHYSICAL EXAM: VS:  BP 140/84 mmHg  Pulse 60  Ht 5\' 9"  (1.753 m)  Wt 156 lb (70.761 kg)  BMI 23.03 kg/m2 , BMI Body mass index is 23.03 kg/(m^2). GEN: Well  nourished, well developed, in no acute distress HEENT: normal Neck: no JVD, carotid bruits, or masses Cardiac: RRR; no murmurs, rubs, or gallops,no edema  Respiratory:  clear to auscultation bilaterally, normal work of breathing GI: soft, nontender, nondistended, + BS MS: no deformity or atrophy Skin: warm and dry, no rash Neuro:  Strength and sensation are intact Psych: euthymic mood, full affect   EKG:  EKG was ordered today and showed NSR with LVH by repol    Recent Labs: 10/27/2014: ALT 207*; BUN 21; Creatinine, Ser 0.87; Potassium 4.3; Sodium 137    Lipid Panel    Component Value Date/Time   CHOL 161 10/27/2014 0745   TRIG 82.0 10/27/2014 0745   HDL 57.40 10/27/2014 0745   CHOLHDL 3 10/27/2014 0745   VLDL 16.4  10/27/2014 0745   LDLCALC 87 10/27/2014 0745   LDLDIRECT 87.0 05/18/2014 0912      Wt Readings from Last 3 Encounters:  04/26/15 156 lb (70.761 kg)  03/20/15 155 lb (70.308 kg)  10/18/14 152 lb 3.2 oz (69.037 kg)    ASSESSMENT AND PLAN:  1. Coronary Artery Disease: He has stopped smoking cigarettes. He has no angina  - Continue ASA and beta blocker.  He was taken off statins due to elevated LFTs.   2. Hypertension: Controlled. Continue BB/ACE I 3. Hyperlipidemia: LDL 87 in July  - he is off statin therapy due to elevated LFTs.  Repeat labs were done by PCP and LFTs normalized.     - Will refer to lipid clinic to see if he qualifies for PSCK 9 drug. 4. Moderate carotid artery stenosis 1-39% left ICA and 40-59% right ICA stenosis - continue ASA. Repeat carotid dopplers 02/2016 5. Heart murmur - 2D echo with mild AR 6. Mildly dilated aortic root - follow with yearly echo 7. Mild pulmonary HTN - follow with yearly echo    Current medicines are reviewed at length with the patient today.  The patient does not have concerns regarding medicines.  The following changes have been made:  no change  Labs/ tests ordered today: See above Assessment and Plan No orders of the defined types were placed in this encounter.     Disposition:   FU with me in 1 year  Signed, Sueanne Margarita, MD  04/26/2015 3:33 PM    [facility] [place], [place], [place]  [address] Phone: [phone]; Fax: [phone]

## 2015-04-26 NOTE — Addendum Note (Signed)
Addended by: Fransico Him R on: 04/26/2015 04:02 PM   Modules accepted: Miquel Dunn

## 2015-05-06 MED FILL — METOPROLOL TARTRATE 25 MG T: 25 | 90 days supply | Qty: 90 | Fill #2

## 2015-05-06 MED FILL — BUPROPION HCL SR 100 MG TAB: 100 | 30 days supply | Qty: 30 | Fill #7

## 2015-05-30 ENCOUNTER — Other Ambulatory Visit (HOSPITAL_COMMUNITY): Payer: BLUE CROSS/BLUE SHIELD

## 2015-06-05 ENCOUNTER — Other Ambulatory Visit: Payer: Self-pay

## 2015-06-05 ENCOUNTER — Ambulatory Visit (INDEPENDENT_AMBULATORY_CARE_PROVIDER_SITE_OTHER): Payer: BLUE CROSS/BLUE SHIELD

## 2015-06-05 DIAGNOSIS — I77810 Thoracic aortic ectasia: Secondary | ICD-10-CM | POA: Diagnosis not present

## 2015-06-06 ENCOUNTER — Ambulatory Visit (INDEPENDENT_AMBULATORY_CARE_PROVIDER_SITE_OTHER): Payer: BLUE CROSS/BLUE SHIELD | Admitting: Pharmacist

## 2015-06-06 DIAGNOSIS — E785 Hyperlipidemia, unspecified: Secondary | ICD-10-CM | POA: Diagnosis not present

## 2015-06-06 MED ORDER — EZETIMIBE 10 MG PO TABS: 10.0000 mg | ORAL_TABLET | Freq: Every day | ORAL | Status: DC

## 2015-06-06 MED FILL — ZETIA 10 MG TABLET: 10 | 30 days supply | Qty: 30 | Fill #0

## 2015-06-06 MED FILL — BUPROPION HCL SR 100 MG TAB: 100 | 30 days supply | Qty: 30 | Fill #8

## 2015-06-06 NOTE — Patient Instructions (Signed)
Start taking Zetia 10mg  once daily Please fax over cholesterol and liver enzyme results to lipid clinic [phone] We will recheck your liver enzymes in 1 month and your cholesterol in 3 months

## 2015-06-06 NOTE — Progress Notes (Signed)
Patient ID: Travis MOTYKA                  DOB: 1950-12-01                         MRN: [id number]     HPI: Travis Harrison is a 65 y.o. male patient referred to lipid clinic by Dr. Radford Pax. Pt has a PMH of HTN, dyslipidemia, moderate carotid artery stenosis and ASCAD s/p CABG. Patient has a history of elevated LFTs with atorvastatin. It was recommended that he follow up with a GI consult however pt refused. He has not tried any other statins. He is not currently taking any medications for lipids.   Pt reports that he also sees Dr. Lorenda Hatchet who manages his cholesterol. He had labwork drawn there a few months ago and will have that faxed over to [place] today. He reports that his LFTs have normalized since he discontinued Lipitor 3 months ago.  Current Medications: none Intolerances: atorvastatin 27m, 241m [id number] LFTs) Risk Factors: CAD s/p CABG LDL goal: <70mg44m  Diet: Patient states he tries to stay away from sodium, no red meat, very little processed foods. His wife cooks most of their meals at home.  Exercise: Reports walking a mile per day to stay active.  Family History: The patient's family history includes CVA in his father; Heart attack in his mother; Heart attack (age of onset: 47) i59his father; Heart disease in his father and mother; Hypertension in his father.   Social History: The patient reports that he quit smoking about 16 years ago. He does not have any smokeless tobacco history on file. He reports that he drinks about 3 beers per day.   Labs:  10/2014: TC 161, TG 82, LDL 87, HDL 57.4, alk phos 727, AST 213, ALT 207 (on atorvastatin)  Past Medical History  Diagnosis Date  . Hyperlipidemia   . Hypertension   . 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 Carotid artery stenosis, asymptomatic     RICA 40-5946-80%LICA 1-39%3-21%Current Outpatient Prescriptions on File Prior to Visit  Medication Sig Dispense Refill  . aspirin 81 MG EC tablet  Take 1 tablet (81 mg total) by mouth daily.    . buPMarland KitchenOPion (WELLBUTRIN SR) 100 MG 12 hr tablet Take 100 mg by mouth daily.    . lisMarland Kitchennopril (PRINIVIL,ZESTRIL) 10 MG tablet Take 1 tablet (10 mg total) by mouth daily. 90 tablet 1  . metoprolol tartrate (LOPRESSOR) 25 MG tablet Take 0.5 tablets (12.5 mg total) by mouth 2 (two) times daily. 90 tablet 3  . Omega-3 Fatty Acids (FISH OIL) 1000 MG CAPS Take 1 capsule by mouth daily.     No current facility-administered medications on file prior to visit.    No Known Allergies  Assessment/Plan:  1. Hyperlipidemia - Patient was previously on multiple doses of atorvastatin but developed elevated LFTs. He has not tried any other statins. Most recent LDL was 87 in July 2016 although pt was taking Lipitor at that time. He will have Dr. HusseChanning Muttersce fax over lipids/LFTs from a few months ago (drawn after Lipitor was d/c'ed). Will avoid statin class for now and start Zetia 10 mg daily which should not cause elevated LFTs. Will check LFTs in 1 month and lipid panel/LFTs in 3 months.   Adiva Boettner E. Debanhi Blaker, PharmD, CPP [facility] [address], [place], [place] [address] Phone: [phone]; Fax: [phone] 06/06/2015 9:29 AM    Addendum:  LFTs faxed over from PCP office on 06/12/15: 03/30/15: ALP 104, ALT 33, AST 31 - improved after d/cing atorvastatin 02/28/15: ALP 346, ALT 164, AST 131

## 2015-06-07 ENCOUNTER — Telehealth: Payer: Self-pay

## 2015-06-07 NOTE — Telephone Encounter (Signed)
Prior auth for Ezitimie 10mg  sent to [employer].

## 2015-06-14 ENCOUNTER — Other Ambulatory Visit: Payer: Self-pay

## 2015-06-14 MED ORDER — ZETIA 10 MG PO TABS: 10.0000 mg | ORAL_TABLET | Freq: Every day | ORAL | Status: DC

## 2015-06-14 NOTE — Telephone Encounter (Signed)
[employer] will not pay for Ezetimibe, till patient tries and fails Zetia. New rx sent to [facility] for the name brand Zetia 10mg  Left patient a message about this.

## 2015-06-29 MED FILL — ZETIA 10 MG TABLET: 10 | 30 days supply | Qty: 30 | Fill #1

## 2015-07-03 MED FILL — BUPROPION HCL SR 100 MG TAB: 100 | 30 days supply | Qty: 30 | Fill #9

## 2015-07-04 ENCOUNTER — Other Ambulatory Visit (INDEPENDENT_AMBULATORY_CARE_PROVIDER_SITE_OTHER): Payer: BLUE CROSS/BLUE SHIELD | Admitting: *Deleted

## 2015-07-04 DIAGNOSIS — E785 Hyperlipidemia, unspecified: Secondary | ICD-10-CM

## 2015-07-04 LAB — HEPATIC FUNCTION PANEL
ALBUMIN: 3.9 g/dL (ref 3.6–5.1)
ALK PHOS: 73 U/L (ref 40–115)
ALT: 33 U/L (ref 9–46)
AST: 26 U/L (ref 10–35)
BILIRUBIN INDIRECT: 0.3 mg/dL (ref 0.2–1.2)
Bilirubin, Direct: 0.1 mg/dL (ref <=0.2)
TOTAL PROTEIN: 6.3 g/dL (ref 6.1–8.1)
Total Bilirubin: 0.4 mg/dL (ref 0.2–1.2)

## 2015-07-04 NOTE — Addendum Note (Signed)
Addended by: Eulis Foster on: 07/04/2015 07:41 AM   Modules accepted: Orders

## 2015-07-04 NOTE — Addendum Note (Signed)
Addended by: Eulis Foster on: 07/04/2015 07:42 AM   Modules accepted: Orders

## 2015-07-04 NOTE — Addendum Note (Signed)
Addended by: Eulis Foster on: 07/04/2015 07:40 AM   Modules accepted: Orders

## 2015-08-03 MED FILL — ZETIA 10 MG TABLET: 10 | 30 days supply | Qty: 30 | Fill #2

## 2015-08-03 MED FILL — BUPROPION HCL SR 100 MG TAB: 100 | 30 days supply | Qty: 30 | Fill #10

## 2015-08-22 ENCOUNTER — Other Ambulatory Visit: Payer: Self-pay | Admitting: Cardiology

## 2015-08-23 MED FILL — LISINOPRIL 10 MG TABLET: 10 | 90 days supply | Qty: 90 | Fill #0

## 2015-09-04 ENCOUNTER — Other Ambulatory Visit (INDEPENDENT_AMBULATORY_CARE_PROVIDER_SITE_OTHER): Payer: BLUE CROSS/BLUE SHIELD | Admitting: *Deleted

## 2015-09-04 DIAGNOSIS — E785 Hyperlipidemia, unspecified: Secondary | ICD-10-CM | POA: Diagnosis not present

## 2015-09-04 LAB — LIPID PANEL
CHOLESTEROL: 201 mg/dL — AB (ref 125–200)
HDL: 47 mg/dL (ref >=40)
LDL Cholesterol: 121 mg/dL (ref <130)
TRIGLYCERIDES: 166 mg/dL — AB (ref <150)
Total CHOL/HDL Ratio: 4.3 Ratio (ref <=5.0)
VLDL: 33 mg/dL — ABNORMAL HIGH (ref <30)

## 2015-09-04 LAB — HEPATIC FUNCTION PANEL
ALK PHOS: 55 U/L (ref 40–115)
ALT: 26 U/L (ref 9–46)
AST: 28 U/L (ref 10–35)
Albumin: 4 g/dL (ref 3.6–5.1)
BILIRUBIN DIRECT: 0.1 mg/dL (ref <=0.2)
BILIRUBIN INDIRECT: 0.7 mg/dL (ref 0.2–1.2)
TOTAL PROTEIN: 6.1 g/dL (ref 6.1–8.1)
Total Bilirubin: 0.8 mg/dL (ref 0.2–1.2)

## 2015-09-04 NOTE — Addendum Note (Signed)
Addended by: Eulis Foster on: 09/04/2015 07:40 AM   Modules accepted: Orders

## 2015-09-11 MED FILL — BUPROPION HCL SR 100 MG TAB: 100 | 30 days supply | Qty: 30 | Fill #0

## 2015-09-12 ENCOUNTER — Telehealth: Payer: Self-pay | Admitting: Pharmacist

## 2015-09-12 MED ORDER — EVOLOCUMAB 140 MG/ML ~~LOC~~ SOAJ: 140.0000 mg | SUBCUTANEOUS | Status: DC

## 2015-09-12 NOTE — Telephone Encounter (Signed)
Received approval for Repatha 140mg  syringe for patient.  He uses [facility]  Confirmed they could fill medication for patient.  Rx sent.  Pt's wife given discount card.  Will have patient come in for first injection once he receives the medication.

## 2015-09-14 ENCOUNTER — Other Ambulatory Visit: Payer: Self-pay | Admitting: Pharmacist

## 2015-09-14 MED ORDER — EVOLOCUMAB 140 MG/ML ~~LOC~~ SOAJ: 140.0000 mg | SUBCUTANEOUS | Status: DC

## 2015-09-21 DIAGNOSIS — R7301 Impaired fasting glucose: Secondary | ICD-10-CM | POA: Diagnosis not present

## 2015-09-21 DIAGNOSIS — Z23 Encounter for immunization: Secondary | ICD-10-CM | POA: Diagnosis not present

## 2015-09-21 DIAGNOSIS — Z125 Encounter for screening for malignant neoplasm of prostate: Secondary | ICD-10-CM | POA: Diagnosis not present

## 2015-09-21 DIAGNOSIS — Z0000 Encounter for general adult medical examination without abnormal findings: Secondary | ICD-10-CM | POA: Diagnosis not present

## 2015-10-09 MED FILL — BUPROPION HCL SR 100 MG TAB: 100 | 30 days supply | Qty: 30 | Fill #1

## 2015-11-05 MED FILL — BUPROPION HCL SR 100 MG TAB: 100 | 30 days supply | Qty: 30 | Fill #2

## 2015-12-03 ENCOUNTER — Encounter: Payer: Self-pay | Admitting: Cardiology

## 2015-12-03 MED FILL — LISINOPRIL 10 MG TABLET: 10 | 90 days supply | Qty: 90 | Fill #1

## 2015-12-04 ENCOUNTER — Other Ambulatory Visit: Payer: Self-pay

## 2015-12-04 DIAGNOSIS — I10 Essential (primary) hypertension: Secondary | ICD-10-CM

## 2015-12-04 DIAGNOSIS — I2510 Atherosclerotic heart disease of native coronary artery without angina pectoris: Secondary | ICD-10-CM

## 2015-12-04 MED ORDER — METOPROLOL TARTRATE 25 MG PO TABS
12.5000 mg | ORAL_TABLET | Freq: Two times a day (BID) | ORAL | 1 refills | Status: DC
Start: 2015-12-04 — End: 2016-07-02

## 2015-12-04 MED FILL — METOPROLOL TARTRATE 25 MG T: 25 | 90 days supply | Qty: 90 | Fill #0

## 2015-12-16 MED FILL — BUPROPION HCL SR 100 MG TAB: 100 | 30 days supply | Qty: 30 | Fill #3

## 2016-01-19 MED FILL — BUPROPION HCL SR 100 MG TAB: 100 | 30 days supply | Qty: 30 | Fill #4

## 2016-02-15 MED FILL — BUPROPION HCL SR 100 MG TAB: 100 | 30 days supply | Qty: 30 | Fill #5

## 2016-02-21 ENCOUNTER — Ambulatory Visit: Payer: BLUE CROSS/BLUE SHIELD

## 2016-02-21 DIAGNOSIS — I6529 Occlusion and stenosis of unspecified carotid artery: Secondary | ICD-10-CM

## 2016-02-21 DIAGNOSIS — I6523 Occlusion and stenosis of bilateral carotid arteries: Secondary | ICD-10-CM | POA: Diagnosis not present

## 2016-02-23 ENCOUNTER — Telehealth: Payer: Self-pay

## 2016-02-23 ENCOUNTER — Encounter: Payer: Self-pay | Admitting: Cardiology

## 2016-02-23 DIAGNOSIS — I6523 Occlusion and stenosis of bilateral carotid arteries: Secondary | ICD-10-CM

## 2016-02-23 NOTE — Telephone Encounter (Signed)
Informed patient's DPR of results and verbal understanding expressed.   Repeat carotids ordered to be scheduled in 1 year. DPR agrees with treatment plan.  

## 2016-02-23 NOTE — Telephone Encounter (Signed)
-----   Message from Sueanne Margarita, MD sent at 02/23/2016  3:27 PM EDT ----- 40-59% right and 1-39% left ICA stenosis.  Repeat dopplers in 1 year

## 2016-03-04 MED FILL — AMOXICILLIN 875 MG TABLET: 875 | 10 days supply | Qty: 20 | Fill #0

## 2016-03-11 ENCOUNTER — Other Ambulatory Visit: Payer: Self-pay | Admitting: Pharmacist

## 2016-03-11 DIAGNOSIS — E785 Hyperlipidemia, unspecified: Secondary | ICD-10-CM

## 2016-03-12 MED FILL — HYDROCODON-APAP 5-325: 5-325 | 1 days supply | Qty: 10 | Fill #0

## 2016-03-13 ENCOUNTER — Other Ambulatory Visit: Payer: BLUE CROSS/BLUE SHIELD

## 2016-03-16 MED FILL — LISINOPRIL 10 MG TABLET: 10 | 90 days supply | Qty: 90 | Fill #2

## 2016-03-16 MED FILL — METOPROLOL TARTRATE 25 MG T: 25 | 90 days supply | Qty: 90 | Fill #1

## 2016-03-16 MED FILL — BUPROPION HCL SR 100 MG TAB: 100 | 30 days supply | Qty: 30 | Fill #6

## 2016-03-18 ENCOUNTER — Telehealth: Payer: Self-pay | Admitting: *Deleted

## 2016-03-18 NOTE — Telephone Encounter (Signed)
Already aware. Pt  Needs lab work on [place] before we can submit PA for reauthorization. Informed pharmacist at specialty pharmacy.

## 2016-03-18 NOTE — Telephone Encounter (Signed)
call [facility] about PA on repatha, thanks

## 2016-03-21 ENCOUNTER — Other Ambulatory Visit: Payer: BLUE CROSS/BLUE SHIELD | Admitting: *Deleted

## 2016-03-21 DIAGNOSIS — E785 Hyperlipidemia, unspecified: Secondary | ICD-10-CM | POA: Diagnosis not present

## 2016-03-21 LAB — HEPATIC FUNCTION PANEL
ALK PHOS: 66 U/L (ref 40–115)
ALT: 33 U/L (ref 9–46)
AST: 26 U/L (ref 10–35)
Albumin: 4 g/dL (ref 3.6–5.1)
BILIRUBIN DIRECT: 0.1 mg/dL (ref <=0.2)
BILIRUBIN TOTAL: 0.7 mg/dL (ref 0.2–1.2)
Indirect Bilirubin: 0.6 mg/dL (ref 0.2–1.2)
Total Protein: 6 g/dL — ABNORMAL LOW (ref 6.1–8.1)

## 2016-03-21 LAB — LIPID PANEL
Cholesterol: 132 mg/dL (ref <200)
HDL: 45 mg/dL (ref >40)
LDL Cholesterol: 72 mg/dL (ref <100)
TRIGLYCERIDES: 77 mg/dL (ref <150)
Total CHOL/HDL Ratio: 2.9 Ratio (ref <5.0)
VLDL: 15 mg/dL (ref <30)

## 2016-03-25 ENCOUNTER — Telehealth: Payer: Self-pay | Admitting: Cardiology

## 2016-03-25 NOTE — Telephone Encounter (Signed)
Travis Harrison is calling from [place] to check the status of an prior auth first faxed over on Nov 21. Was told blood work was needed pt completed it Nov 30. When calling back please use [id number]

## 2016-03-25 NOTE — Telephone Encounter (Signed)
PA was already faxed on 12/1 after patient had lipid panel drawn. Will notify specialty pharmacy once PA is approved.

## 2016-04-11 MED FILL — BUPROPION HCL SR 100 MG TAB: 100 | 30 days supply | Qty: 30 | Fill #7

## 2016-04-22 HISTORY — PX: KNEE ARTHROSCOPY: SHX127

## 2016-04-29 ENCOUNTER — Ambulatory Visit: Payer: BLUE CROSS/BLUE SHIELD | Admitting: Cardiology

## 2016-04-30 ENCOUNTER — Ambulatory Visit: Payer: BLUE CROSS/BLUE SHIELD | Admitting: Cardiology

## 2016-05-23 MED FILL — BUPROPION HCL SR 100 MG TAB: 100 | 30 days supply | Qty: 30 | Fill #8

## 2016-06-06 ENCOUNTER — Encounter: Payer: Self-pay | Admitting: Cardiology

## 2016-06-06 ENCOUNTER — Ambulatory Visit (INDEPENDENT_AMBULATORY_CARE_PROVIDER_SITE_OTHER): Payer: BLUE CROSS/BLUE SHIELD | Admitting: Cardiology

## 2016-06-06 VITALS — BP 148/88 | HR 54 | Ht 68.5 in | Wt 157.8 lb

## 2016-06-06 DIAGNOSIS — E785 Hyperlipidemia, unspecified: Secondary | ICD-10-CM | POA: Diagnosis not present

## 2016-06-06 DIAGNOSIS — I6523 Occlusion and stenosis of bilateral carotid arteries: Secondary | ICD-10-CM

## 2016-06-06 DIAGNOSIS — I2510 Atherosclerotic heart disease of native coronary artery without angina pectoris: Secondary | ICD-10-CM | POA: Diagnosis not present

## 2016-06-06 DIAGNOSIS — I10 Essential (primary) hypertension: Secondary | ICD-10-CM | POA: Diagnosis not present

## 2016-06-06 MED ORDER — LISINOPRIL 20 MG PO TABS: 20.0000 mg | ORAL_TABLET | Freq: Every day | ORAL | 11 refills | Status: DC

## 2016-06-06 MED FILL — LISINOPRIL 20 MG TABLET: 20 | 30 days supply | Qty: 30 | Fill #0

## 2016-06-06 NOTE — Progress Notes (Signed)
Cardiology Office Note    Date:  06/06/2016   ID:  Travis Harrison, DOB 1951/04/01, MRN [id number]  PCP:  Travis Low, MD  Cardiologist:  Travis Him, MD   Chief Complaint  Patient presents with  . Coronary Artery Disease  . Hypertension  . Hyperlipidemia    History of Present Illness:  Travis Harrison is a 66 y.o. male with a hx of HTN, dyslipidemia, moderate carotid artery stenosis and ASCAD s/p CABG by Dr. Roxy Manns with a L-LAD and RIMA-OM1. Pre-op carotid US demonstrated RICA 123456 and LICA 123456. He returns for FU. He denies any anginal CP, SOB, DOE, orthopnea, PND, edema. No syncope, palpitations or dizziness.     Past Medical History:  Diagnosis Date  . Carotid artery stenosis, asymptomatic    RICA 123456 and LICA 123456   . Hyperlipidemia   . Hypertension   . 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 CARDIAC CATHETERIZATION    . CORONARY ARTERY BYPASS GRAFT N/A 03/04/2014   Procedure: CORONARY ARTERY BYPASS GRAFTING (CABG);  Surgeon: Rexene Alberts, MD;  Location: [facility];  Service: Open Heart Surgery;  Laterality: N/A;  . INTRAOPERATIVE TRANSESOPHAGEAL ECHOCARDIOGRAM N/A 03/04/2014   Procedure: INTRAOPERATIVE TRANSESOPHAGEAL ECHOCARDIOGRAM;  Surgeon: Rexene Alberts, MD;  Location: [facility];  Service: Open Heart Surgery;  Laterality: N/A;  . LEFT HEART CATHETERIZATION WITH CORONARY ANGIOGRAM N/A 03/03/2014   Procedure: LEFT HEART CATHETERIZATION WITH CORONARY ANGIOGRAM;  Surgeon: Blane Ohara, MD;  Location: [facility] CATH LAB;  Service: Cardiovascular;  Laterality: N/A;    Current Medications: Current Meds  Medication Sig  . aspirin 81 MG EC tablet Take 1 tablet (81 mg total) by mouth daily.  Marland Kitchen buPROPion (WELLBUTRIN SR) 100 MG 12 hr tablet Take 100 mg by mouth daily.  . Evolocumab (REPATHA SURECLICK) [id number] MG/ML SOAJ Inject 140 mg into the skin every 14 (fourteen) days.  Marland Kitchen lisinopril  (PRINIVIL,ZESTRIL) 10 MG tablet TAKE 1 TABLET BY MOUTH DAILY.  . metoprolol tartrate (LOPRESSOR) 25 MG tablet Take 0.5 tablets (12.5 mg total) by mouth 2 (two) times daily.  . Omega-3 Fatty Acids (FISH OIL) 1000 MG CAPS Take 1 capsule by mouth daily.  Marland Kitchen ZETIA 10 MG tablet Take 1 tablet (10 mg total) by mouth daily.    Allergies:   Patient has no known allergies.   Social History   Social History  . Marital status: Divorced    Spouse name: N/A  . Number of children: N/A  . Years of education: N/A   Social History Main Topics  . Smoking status: Former Smoker    Quit date: 03/04/1999  . Smokeless tobacco: Never Used  . Alcohol use 24.0 oz/week    40 Cans of beer per week  . Drug use: Unknown  . Sexual activity: Not Asked   Other Topics Concern  . None   Social History Narrative   Married and lives with his wife.  Works for [employer].  Physically active, walks 2 miles daily.     Family History:  The patient's family history includes CVA in his father; Heart attack in his mother; Heart attack (age of onset: 30) in his father; Heart disease in his father and mother; Hypertension in his father.   ROS:   Please see the history of present illness.    ROS All other systems reviewed and are negative.  No flowsheet data found.     PHYSICAL EXAM:   VS:  BP (!) 148/88   Pulse (!) 54   Ht 5' 8.5" (1.74 m)   Wt 157 lb 12.8 oz (71.6 kg)   BMI 23.64 kg/m    GEN: Well nourished, well developed, in no acute distress  HEENT: normal  Neck: no JVD, carotid bruits, or masses Cardiac: RRR; no murmurs, rubs, or gallops,no edema.  Intact distal pulses bilaterally.  Respiratory:  clear to auscultation bilaterally, normal work of breathing GI: soft, nontender, nondistended, + BS MS: no deformity or atrophy  Skin: warm and dry, no rash Neuro:  Alert and Oriented x 3, Strength and sensation are intact Psych: euthymic mood, full affect  Wt Readings from Last 3 Encounters:    06/06/16 157 lb 12.8 oz (71.6 kg)  04/26/15 156 lb (70.8 kg)  03/20/15 155 lb (70.3 kg)      Studies/Labs Reviewed:   EKG:  EKG is ordered today.  The ekg ordered today demonstrates sinus bradycardia at 54bpm with normal intervals except for first degree AV block  Recent Labs: 03/21/2016: ALT 33   Lipid Panel    Component Value Date/Time   CHOL 132 03/21/2016 0730   TRIG 77 03/21/2016 0730   HDL 45 03/21/2016 0730   CHOLHDL 2.9 03/21/2016 0730   VLDL 15 03/21/2016 0730   LDLCALC 72 03/21/2016 0730   LDLDIRECT 87.0 05/18/2014 0912    Additional studies/ records that were reviewed today include:  none    ASSESSMENT:    1. Coronary artery disease involving native coronary artery of native heart without angina pectoris   2. Essential hypertension   3. Asymptomatic bilateral carotid artery stenosis   4. Hyperlipidemia with target LDL less than 70      PLAN:  In order of problems listed above:  1. ASCAD s/p CABG - he has no anginal symptoms.  He will continue on ASA/BB and zetia. 2. HTN - BP borderline controlled on current meds.  Continue ACE I and BB. His BP is elevated so I have instructed Harrison to increase his Lisinopril to 20mg  daily and followup in HTN clinic in 1 week with BMET. 3. Asymptomatic bilateral carotid artery stenosis - 40-59% right and 1-39% left ICA stenosis. Repeat dopplers in 1 year.  Continue ASA. 4. Hyperlipidemia with LDL goal < 70.  Continue zetia.  He is statin intolerant.Marland Kitchen  LDL in 02/2016 was 72.    Medication Adjustments/Labs and Tests Ordered: Current medicines are reviewed at length with the patient today.  Concerns regarding medicines are outlined above.  Medication changes, Labs and Tests ordered today are listed in the Patient Instructions below.  There are no Patient Instructions on file for this visit.   Signed, Travis Him, MD  06/06/2016 8:15 AM    [facility] [place], [place], [place]   [address] Phone: [phone]; Fax: [phone]

## 2016-06-06 NOTE — Patient Instructions (Signed)
Medication Instructions:  1) INCREASE LISINOPRIL to 20 mg daily  Labwork: IN ONE WEEK with HTN CLINIC visit: BMET  FASTING LABS IN MAY: Lipids, liver  Testing/Procedures: None  Follow-Up: Your physician recommends that you schedule a follow-up appointment in 1 WEEK in the HTN CLINIC.  Your physician wants you to follow-up in: 1 year with Dr. Radford Pax. You will receive a reminder letter in the mail two months in advance. If you don't receive a letter, please call our office to schedule the follow-up appointment.   Any Other Special Instructions Will Be Listed Below (If Applicable).     If you need a refill on your cardiac medications before your next appointment, please call your pharmacy.

## 2016-06-07 DIAGNOSIS — M25561 Pain in right knee: Secondary | ICD-10-CM | POA: Diagnosis not present

## 2016-06-07 DIAGNOSIS — M25562 Pain in left knee: Secondary | ICD-10-CM | POA: Diagnosis not present

## 2016-06-12 ENCOUNTER — Telehealth: Payer: Self-pay

## 2016-06-12 NOTE — Telephone Encounter (Signed)
Patient's wife in the office and reports her husband cannot come to the office for HTN Clinic appointment/lab work because he has to walk 3.5 miles each way to get there.  She requests an rx for blood work to be drawn at the [facility] office the same day he has an MRI for his knee. Gave her rx for BMET. Instructed her to have patient check BP daily for a week 2 hours after taking medications and to contact the office with results. She was grateful for assistance.

## 2016-06-20 ENCOUNTER — Other Ambulatory Visit: Payer: BLUE CROSS/BLUE SHIELD | Admitting: *Deleted

## 2016-06-20 DIAGNOSIS — I10 Essential (primary) hypertension: Secondary | ICD-10-CM | POA: Diagnosis not present

## 2016-06-20 DIAGNOSIS — I2510 Atherosclerotic heart disease of native coronary artery without angina pectoris: Secondary | ICD-10-CM | POA: Diagnosis not present

## 2016-06-20 DIAGNOSIS — E785 Hyperlipidemia, unspecified: Secondary | ICD-10-CM | POA: Diagnosis not present

## 2016-06-20 DIAGNOSIS — M25562 Pain in left knee: Secondary | ICD-10-CM | POA: Diagnosis not present

## 2016-06-20 DIAGNOSIS — M25561 Pain in right knee: Secondary | ICD-10-CM | POA: Diagnosis not present

## 2016-06-20 LAB — BASIC METABOLIC PANEL
BUN/Creatinine Ratio: 29 — ABNORMAL HIGH (ref 10–24)
BUN: 26 mg/dL (ref 8–27)
CO2: 25 mmol/L (ref 18–29)
CREATININE: 0.89 mg/dL (ref 0.76–1.27)
Calcium: 9.1 mg/dL (ref 8.6–10.2)
Chloride: 101 mmol/L (ref 96–106)
GFR calc Af Amer: 102 mL/min/{1.73_m2} (ref >59)
GFR, EST NON AFRICAN AMERICAN: 88 mL/min/{1.73_m2} (ref >59)
Glucose: 90 mg/dL (ref 65–99)
Potassium: 4.3 mmol/L (ref 3.5–5.2)
SODIUM: 140 mmol/L (ref 134–144)

## 2016-06-20 LAB — HEPATIC FUNCTION PANEL
ALK PHOS: 48 IU/L (ref 39–117)
ALT: 31 IU/L (ref 0–44)
AST: 33 IU/L (ref 0–40)
Albumin: 4.2 g/dL (ref 3.6–4.8)
Bilirubin Total: 0.3 mg/dL (ref 0.0–1.2)
Bilirubin, Direct: 0.11 mg/dL (ref 0.00–0.40)
Total Protein: 5.9 g/dL — ABNORMAL LOW (ref 6.0–8.5)

## 2016-06-20 LAB — LIPID PANEL
Chol/HDL Ratio: 3.4 ratio units (ref 0.0–5.0)
Cholesterol, Total: 136 mg/dL (ref 100–199)
HDL: 40 mg/dL (ref >39)
LDL Calculated: 70 mg/dL (ref 0–99)
TRIGLYCERIDES: 128 mg/dL (ref 0–149)
VLDL Cholesterol Cal: 26 mg/dL (ref 5–40)

## 2016-06-25 ENCOUNTER — Other Ambulatory Visit: Payer: Self-pay | Admitting: Cardiology

## 2016-07-02 ENCOUNTER — Other Ambulatory Visit: Payer: Self-pay | Admitting: *Deleted

## 2016-07-02 DIAGNOSIS — I10 Essential (primary) hypertension: Secondary | ICD-10-CM

## 2016-07-02 DIAGNOSIS — I2510 Atherosclerotic heart disease of native coronary artery without angina pectoris: Secondary | ICD-10-CM

## 2016-07-02 MED ORDER — METOPROLOL TARTRATE 25 MG PO TABS: 12.5000 mg | ORAL_TABLET | Freq: Two times a day (BID) | ORAL | 3 refills | Status: DC

## 2016-07-02 MED ORDER — LISINOPRIL 20 MG PO TABS: 20.0000 mg | ORAL_TABLET | Freq: Every day | ORAL | 3 refills | Status: DC

## 2016-07-02 MED FILL — BUPROPION HCL SR 100 MG TAB: 100 | 30 days supply | Qty: 30 | Fill #9

## 2016-07-02 MED FILL — LISINOPRIL 20 MG TABLET: 20 | 90 days supply | Qty: 90 | Fill #0

## 2016-07-02 MED FILL — METOPROLOL TARTRATE 25 MG T: 25 | 90 days supply | Qty: 90 | Fill #0

## 2016-07-08 DIAGNOSIS — M25561 Pain in right knee: Secondary | ICD-10-CM | POA: Diagnosis not present

## 2016-07-08 DIAGNOSIS — S83231D Complex tear of medial meniscus, current injury, right knee, subsequent encounter: Secondary | ICD-10-CM | POA: Diagnosis not present

## 2016-07-08 DIAGNOSIS — S83232D Complex tear of medial meniscus, current injury, left knee, subsequent encounter: Secondary | ICD-10-CM | POA: Diagnosis not present

## 2016-07-08 DIAGNOSIS — M25562 Pain in left knee: Secondary | ICD-10-CM | POA: Diagnosis not present

## 2016-08-07 ENCOUNTER — Telehealth: Payer: Self-pay

## 2016-08-07 NOTE — Telephone Encounter (Signed)
Patient's wife called to ask if fasting labs are still needed in May since cholesterol was checked in March. Informed her lab appointment will be cancelled. She was grateful for assistance.

## 2016-08-08 DIAGNOSIS — G8918 Other acute postprocedural pain: Secondary | ICD-10-CM | POA: Diagnosis not present

## 2016-08-08 DIAGNOSIS — S83231A Complex tear of medial meniscus, current injury, right knee, initial encounter: Secondary | ICD-10-CM | POA: Diagnosis not present

## 2016-08-08 DIAGNOSIS — M23331 Other meniscus derangements, other medial meniscus, right knee: Secondary | ICD-10-CM | POA: Diagnosis not present

## 2016-08-08 DIAGNOSIS — M94261 Chondromalacia, right knee: Secondary | ICD-10-CM | POA: Diagnosis not present

## 2016-08-08 DIAGNOSIS — M1712 Unilateral primary osteoarthritis, left knee: Secondary | ICD-10-CM | POA: Diagnosis not present

## 2016-08-08 DIAGNOSIS — M1711 Unilateral primary osteoarthritis, right knee: Secondary | ICD-10-CM | POA: Diagnosis not present

## 2016-08-08 DIAGNOSIS — M23321 Other meniscus derangements, posterior horn of medial meniscus, right knee: Secondary | ICD-10-CM | POA: Diagnosis not present

## 2016-08-08 DIAGNOSIS — M23322 Other meniscus derangements, posterior horn of medial meniscus, left knee: Secondary | ICD-10-CM | POA: Diagnosis not present

## 2016-08-08 DIAGNOSIS — M23332 Other meniscus derangements, other medial meniscus, left knee: Secondary | ICD-10-CM | POA: Diagnosis not present

## 2016-08-08 DIAGNOSIS — M94262 Chondromalacia, left knee: Secondary | ICD-10-CM | POA: Diagnosis not present

## 2016-08-08 DIAGNOSIS — S83271A Complex tear of lateral meniscus, current injury, right knee, initial encounter: Secondary | ICD-10-CM | POA: Diagnosis not present

## 2016-08-08 MED FILL — CEPHALEXIN 500 MG CAPSULE: 500 | 4 days supply | Qty: 12 | Fill #0

## 2016-08-08 MED FILL — HYDROCODON-APAP 5-325: 5-325 | 5 days supply | Qty: 60 | Fill #0

## 2016-08-16 DIAGNOSIS — Z7409 Other reduced mobility: Secondary | ICD-10-CM | POA: Diagnosis not present

## 2016-08-19 MED FILL — BUPROPION HCL SR 100 MG TAB: 100 | 30 days supply | Qty: 30 | Fill #10

## 2016-08-20 DIAGNOSIS — Z7409 Other reduced mobility: Secondary | ICD-10-CM | POA: Diagnosis not present

## 2016-08-21 ENCOUNTER — Other Ambulatory Visit: Payer: BLUE CROSS/BLUE SHIELD

## 2016-08-22 DIAGNOSIS — S83232D Complex tear of medial meniscus, current injury, left knee, subsequent encounter: Secondary | ICD-10-CM | POA: Diagnosis not present

## 2016-08-22 DIAGNOSIS — Z7409 Other reduced mobility: Secondary | ICD-10-CM | POA: Diagnosis not present

## 2016-08-27 DIAGNOSIS — Z7409 Other reduced mobility: Secondary | ICD-10-CM | POA: Diagnosis not present

## 2016-08-29 DIAGNOSIS — Z7409 Other reduced mobility: Secondary | ICD-10-CM | POA: Diagnosis not present

## 2016-09-03 DIAGNOSIS — Z7409 Other reduced mobility: Secondary | ICD-10-CM | POA: Diagnosis not present

## 2016-09-05 DIAGNOSIS — Z7409 Other reduced mobility: Secondary | ICD-10-CM | POA: Diagnosis not present

## 2016-09-17 DIAGNOSIS — Z7409 Other reduced mobility: Secondary | ICD-10-CM | POA: Diagnosis not present

## 2016-09-24 DIAGNOSIS — Z7409 Other reduced mobility: Secondary | ICD-10-CM | POA: Diagnosis not present

## 2016-09-26 DIAGNOSIS — I2510 Atherosclerotic heart disease of native coronary artery without angina pectoris: Secondary | ICD-10-CM | POA: Diagnosis not present

## 2016-09-26 DIAGNOSIS — Z1159 Encounter for screening for other viral diseases: Secondary | ICD-10-CM | POA: Diagnosis not present

## 2016-09-26 DIAGNOSIS — Z125 Encounter for screening for malignant neoplasm of prostate: Secondary | ICD-10-CM | POA: Diagnosis not present

## 2016-09-26 DIAGNOSIS — Z0000 Encounter for general adult medical examination without abnormal findings: Secondary | ICD-10-CM | POA: Diagnosis not present

## 2016-09-26 DIAGNOSIS — I10 Essential (primary) hypertension: Secondary | ICD-10-CM | POA: Diagnosis not present

## 2016-09-26 DIAGNOSIS — R7301 Impaired fasting glucose: Secondary | ICD-10-CM | POA: Diagnosis not present

## 2016-09-26 MED FILL — METOPROLOL SUCC ER 50 MG TA: 50 | 90 days supply | Qty: 90 | Fill #0

## 2016-09-26 MED FILL — BUPROPION HCL SR 100 MG TAB: 100 | 90 days supply | Qty: 90 | Fill #0

## 2016-10-02 DIAGNOSIS — Z7409 Other reduced mobility: Secondary | ICD-10-CM | POA: Diagnosis not present

## 2016-10-02 MED FILL — LISINOPRIL 20 MG TAB: 20 | 90 days supply | Qty: 90 | Fill #1

## 2016-12-31 DIAGNOSIS — E7800 Pure hypercholesterolemia, unspecified: Secondary | ICD-10-CM | POA: Diagnosis not present

## 2016-12-31 DIAGNOSIS — I10 Essential (primary) hypertension: Secondary | ICD-10-CM | POA: Diagnosis not present

## 2017-01-13 MED FILL — METOPROLOL SUCC ER 50 MG TA: 50 | 90 days supply | Qty: 90 | Fill #1

## 2017-01-13 MED FILL — BUPROPION HCL SR 100 MG TAB: 100 | 90 days supply | Qty: 90 | Fill #1

## 2017-01-13 MED FILL — LISINOPRIL 20 MG TAB: 20 | 90 days supply | Qty: 90 | Fill #2

## 2017-01-14 ENCOUNTER — Other Ambulatory Visit: Payer: Self-pay | Admitting: Cardiology

## 2017-02-07 ENCOUNTER — Encounter: Payer: Self-pay | Admitting: Cardiology

## 2017-02-10 ENCOUNTER — Other Ambulatory Visit: Payer: Self-pay

## 2017-02-10 DIAGNOSIS — I6523 Occlusion and stenosis of bilateral carotid arteries: Secondary | ICD-10-CM

## 2017-02-14 ENCOUNTER — Telehealth: Payer: Self-pay | Admitting: Cardiology

## 2017-02-14 NOTE — Telephone Encounter (Signed)
Patient's wife made aware that ECHO not needed at this time. Carotid dopplers will be scheduled at the [facility] office.

## 2017-02-14 NOTE — Telephone Encounter (Signed)
New message       Pt thinks he is due for an echo.  There is no order in computer.  Please call and let wife know.  They want to have carotid and echo done at [facility] office

## 2017-04-09 ENCOUNTER — Ambulatory Visit (INDEPENDENT_AMBULATORY_CARE_PROVIDER_SITE_OTHER): Payer: BLUE CROSS/BLUE SHIELD

## 2017-04-09 ENCOUNTER — Telehealth: Payer: Self-pay | Admitting: Pharmacist

## 2017-04-09 DIAGNOSIS — I6523 Occlusion and stenosis of bilateral carotid arteries: Secondary | ICD-10-CM | POA: Diagnosis not present

## 2017-04-09 NOTE — Telephone Encounter (Signed)
PA for Repatha approved though 04/01/18.

## 2017-04-10 ENCOUNTER — Encounter: Payer: Self-pay | Admitting: Cardiology

## 2017-04-16 ENCOUNTER — Telehealth: Payer: Self-pay

## 2017-04-16 DIAGNOSIS — I6523 Occlusion and stenosis of bilateral carotid arteries: Secondary | ICD-10-CM

## 2017-04-16 NOTE — Telephone Encounter (Signed)
Notes recorded by Teressa Senter, RN on 04/16/2017 at 11:35 AM EST Informed patient's spouse of carotid results (DPR on file). She verbalized understanding and thanked me for the call. Carotid ordered to be scheduled in a year.  Notes recorded by Sueanne Margarita, MD on 04/10/2017 at 9:00 AM EST 1-39% bilateral carotid artery stenosis - repeat in 1 year

## 2017-04-23 MED FILL — LISINOPRIL 20 MG TABLET: 20 | 90 days supply | Qty: 90 | Fill #3

## 2017-04-23 MED FILL — BUPROPION HCL SR 100 MG TAB: 100 | 90 days supply | Qty: 90 | Fill #2

## 2017-04-23 MED FILL — METOPROLOL SUCC ER 50 MG TA: 50 | 90 days supply | Qty: 90 | Fill #2

## 2017-04-30 ENCOUNTER — Other Ambulatory Visit: Payer: Self-pay | Admitting: Pharmacist

## 2017-04-30 MED ORDER — EVOLOCUMAB 140 MG/ML ~~LOC~~ SOAJ: 1.0000 "pen " | SUBCUTANEOUS | 11 refills | Status: DC

## 2017-06-06 ENCOUNTER — Ambulatory Visit: Payer: BLUE CROSS/BLUE SHIELD | Admitting: Cardiology

## 2017-06-06 ENCOUNTER — Encounter: Payer: Self-pay | Admitting: Cardiology

## 2017-06-06 VITALS — BP 148/78 | HR 55 | Ht 68.0 in | Wt 157.4 lb

## 2017-06-06 DIAGNOSIS — E785 Hyperlipidemia, unspecified: Secondary | ICD-10-CM

## 2017-06-06 DIAGNOSIS — I10 Essential (primary) hypertension: Secondary | ICD-10-CM | POA: Diagnosis not present

## 2017-06-06 DIAGNOSIS — I351 Nonrheumatic aortic (valve) insufficiency: Secondary | ICD-10-CM | POA: Diagnosis not present

## 2017-06-06 DIAGNOSIS — I6523 Occlusion and stenosis of bilateral carotid arteries: Secondary | ICD-10-CM | POA: Diagnosis not present

## 2017-06-06 DIAGNOSIS — I2510 Atherosclerotic heart disease of native coronary artery without angina pectoris: Secondary | ICD-10-CM | POA: Diagnosis not present

## 2017-06-06 NOTE — Patient Instructions (Addendum)
Medication Instructions:  Your physician recommends that you continue on your current medications as directed. Please refer to the Current Medication list given to you today.  Labwork: Your physician recommends that you return for lab work on Wednesday June 11, 2017 for kidney function, liver function, and fasting lipids  Testing/Procedures: None ordered   Follow-Up: Your physician wants you to follow-up in: 1 year with Dr. Radford Pax. You will receive a reminder letter in the mail two months in advance. If you don't receive a letter, please call our office to schedule the follow-up appointment.  Any Other Special Instructions Will Be Listed Below (If Applicable).  Thank you for choosing [facility], RN  [phone]   If you need a refill on your cardiac medications before your next appointment, please call your pharmacy.

## 2017-06-06 NOTE — Progress Notes (Signed)
Cardiology Office Note:    Date:  06/06/2017   ID:  Travis Harrison, DOB 01-20-1951, MRN [id number]  PCP:  Wenda Low, MD  Cardiologist:  No primary care provider on file.    Referring MD: Wenda Low, MD   Chief Complaint  Patient presents with  . Coronary Artery Disease  . Hypertension  . Hyperlipidemia    History of Present Illness:    Travis Harrison is a 67 y.o. male with a hx of HTN, dyslipidemia, moderate carotid artery stenosis and ASCAD s/p CABG by Dr. Roxy Manns with a L-LAD and RIMA-OM1. Pre-op carotid US demonstrated RICA 54-00% and LICA 8-67%.He is here today for followup and is doing well.  He denies any chest pain or pressure, SOB, DOE, PND, orthopnea, LE edema, dizziness, palpitations or syncope. He is compliant with his meds and is tolerating meds with no SE.    Past Medical History:  Diagnosis Date  . Carotid artery stenosis, asymptomatic    bilateral 1-39% by dopplers 03/2017  . Hyperlipidemia   . Hypertension   . 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 CARDIAC CATHETERIZATION    . CORONARY ARTERY BYPASS GRAFT N/A 03/04/2014   Procedure: CORONARY ARTERY BYPASS GRAFTING (CABG);  Surgeon: Rexene Alberts, MD;  Location: [facility];  Service: Open Heart Surgery;  Laterality: N/A;  . INTRAOPERATIVE TRANSESOPHAGEAL ECHOCARDIOGRAM N/A 03/04/2014   Procedure: INTRAOPERATIVE TRANSESOPHAGEAL ECHOCARDIOGRAM;  Surgeon: Rexene Alberts, MD;  Location: [facility];  Service: Open Heart Surgery;  Laterality: N/A;  . LEFT HEART CATHETERIZATION WITH CORONARY ANGIOGRAM N/A 03/03/2014   Procedure: LEFT HEART CATHETERIZATION WITH CORONARY ANGIOGRAM;  Surgeon: Blane Ohara, MD;  Location: [facility] CATH LAB;  Service: Cardiovascular;  Laterality: N/A;    Current Medications: Current Meds  Medication Sig  . aspirin 81 MG EC tablet Take 1 tablet (81 mg total) by mouth daily.  Marland Kitchen buPROPion (WELLBUTRIN SR) 100 MG 12 hr  tablet Take 100 mg by mouth daily.  . Evolocumab (REPATHA SURECLICK) 619 MG/ML SOAJ Inject 1 pen into the skin every 14 (fourteen) days.  Marland Kitchen lisinopril (PRINIVIL,ZESTRIL) 20 MG tablet Take 1 tablet (20 mg total) by mouth daily.  . metoprolol tartrate (LOPRESSOR) 25 MG tablet Take 0.5 tablets (12.5 mg total) by mouth 2 (two) times daily.  . Omega-3 Fatty Acids (FISH OIL) 1000 MG CAPS Take 1 capsule by mouth daily.     Allergies:   Patient has no known allergies.   Social History   Socioeconomic History  . Marital status: Divorced    Spouse name: None  . Number of children: None  . Years of education: None  . Highest education level: None  Social Needs  . Financial resource strain: None  . Food insecurity - worry: None  . Food insecurity - inability: None  . Transportation needs - medical: None  . Transportation needs - non-medical: None  Occupational History  . None  Tobacco Use  . Smoking status: Former Smoker    Last attempt to quit: 03/04/1999    Years since quitting: 18.2  . Smokeless tobacco: Never Used  Substance and Sexual Activity  . Alcohol use: Yes    Alcohol/week: 24.0 oz    Types: 40 Cans of beer per week  . Drug use: None  . Sexual activity: None  Other Topics Concern  . None  Social History Narrative   Married  and lives with his wife.  Works for [employer].  Physically active, walks 2 miles daily.     Family History: The patient's family history includes CVA in his father; Heart attack in his mother; Heart attack (age of onset: 76) in his father; Heart disease in his father and mother; Hypertension in his father.  ROS:   Please see the history of present illness.    ROS  All other systems reviewed and negative.   EKGs/Labs/Other Studies Reviewed:    The following studies were reviewed today: none  EKG:  EKG is  ordered today and showed sinus bradycardia at 55bpm with no ST changes and LVH  Recent Labs: 06/20/2016: ALT 31; BUN 26;  Creatinine, Ser 0.89; Potassium 4.3; Sodium 140   Recent Lipid Panel    Component Value Date/Time   CHOL 136 06/20/2016 0729   TRIG 128 06/20/2016 0729   HDL 40 06/20/2016 0729   CHOLHDL 3.4 06/20/2016 0729   CHOLHDL 2.9 03/21/2016 0730   VLDL 15 03/21/2016 0730   LDLCALC 70 06/20/2016 0729   LDLDIRECT 87.0 05/18/2014 0912    Physical Exam:    VS:  BP (!) 148/78   Pulse (!) 55   Ht 5\' 8"  (1.727 m)   Wt 157 lb 6.4 oz (71.4 kg)   BMI 23.93 kg/m     Wt Readings from Last 3 Encounters:  06/06/17 157 lb 6.4 oz (71.4 kg)  06/06/16 157 lb 12.8 oz (71.6 kg)  04/26/15 156 lb (70.8 kg)     GEN:  Well nourished, well developed in no acute distress HEENT: Normal NECK: No JVD; No carotid bruits LYMPHATICS: No lymphadenopathy CARDIAC: RRR, no rubs, gallops.  2/6 SM at RUSB to LLSB RESPIRATORY:  Clear to auscultation without rales, wheezing or rhonchi  ABDOMEN: Soft, non-tender, non-distended MUSCULOSKELETAL:  No edema; No deformity  SKIN: Warm and dry NEUROLOGIC:  Alert and oriented x 3 PSYCHIATRIC:  Normal affect   ASSESSMENT:    1. Coronary artery disease involving native coronary artery of native heart without angina pectoris   2. Essential hypertension   3. Asymptomatic bilateral carotid artery stenosis   4. Hyperlipidemia with target LDL less than 70   5. Nonrheumatic aortic valve insufficiency    PLAN:    In order of problems listed above:  1.  ASCAD - s/p remote CABG.  He will continue on ASA 81mg  daily, Repatha and BB.  2.  HTN - BP is well controlled on exam today. He will continue on Lisiniopril 20mg  daily and lopressor 12.5mg  BID.  I will check a BMET.   3.  Bilateral carotid artery stenosis - dopplers 03/2017 showed 1-39% bilateral.  Continue Repatha and ASA.    4.  Hyperlipidemia with LDL goal < 70.  He will continue on Repatha.  I will check an FLP an ALT.   5.  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  Concerns regarding medicines are outlined above.  Orders Placed This Encounter  Procedures  . EKG 12-Lead   No orders of the defined types were placed in this encounter.   Signed, Fransico Him, MD  06/06/2017 8:18 AM    [place]

## 2017-06-11 ENCOUNTER — Other Ambulatory Visit: Payer: BLUE CROSS/BLUE SHIELD | Admitting: *Deleted

## 2017-06-11 DIAGNOSIS — I2510 Atherosclerotic heart disease of native coronary artery without angina pectoris: Secondary | ICD-10-CM

## 2017-06-11 LAB — BASIC METABOLIC PANEL
BUN / CREAT RATIO: 13 (ref 10–24)
BUN: 12 mg/dL (ref 8–27)
CO2: 24 mmol/L (ref 20–29)
CREATININE: 0.93 mg/dL (ref 0.76–1.27)
Calcium: 9.4 mg/dL (ref 8.6–10.2)
Chloride: 101 mmol/L (ref 96–106)
GFR, EST AFRICAN AMERICAN: 97 mL/min/{1.73_m2} (ref >59)
GFR, EST NON AFRICAN AMERICAN: 84 mL/min/{1.73_m2} (ref >59)
Glucose: 77 mg/dL (ref 65–99)
POTASSIUM: 4.5 mmol/L (ref 3.5–5.2)
SODIUM: 140 mmol/L (ref 134–144)

## 2017-06-11 LAB — HEPATIC FUNCTION PANEL
ALT: 33 IU/L (ref 0–44)
AST: 33 IU/L (ref 0–40)
Albumin: 4.4 g/dL (ref 3.6–4.8)
Alkaline Phosphatase: 67 IU/L (ref 39–117)
BILIRUBIN TOTAL: 0.5 mg/dL (ref 0.0–1.2)
BILIRUBIN, DIRECT: 0.12 mg/dL (ref 0.00–0.40)
TOTAL PROTEIN: 6.4 g/dL (ref 6.0–8.5)

## 2017-06-11 LAB — LIPID PANEL
CHOL/HDL RATIO: 3.4 ratio (ref 0.0–5.0)
Cholesterol, Total: 167 mg/dL (ref 100–199)
HDL: 49 mg/dL (ref >39)
LDL CALC: 98 mg/dL (ref 0–99)
Triglycerides: 101 mg/dL (ref 0–149)
VLDL Cholesterol Cal: 20 mg/dL (ref 5–40)

## 2017-08-04 ENCOUNTER — Other Ambulatory Visit: Payer: Self-pay | Admitting: Cardiology

## 2017-08-04 MED FILL — METOPROLOL SUCC ER 50 MG TA: 50 | 90 days supply | Qty: 90 | Fill #3

## 2017-08-04 MED FILL — BUPROPION HCL SR 100 MG TAB: 100 | 90 days supply | Qty: 90 | Fill #3

## 2017-08-05 MED FILL — LISINOPRIL 20 MG TABLET: 20 | 90 days supply | Qty: 90 | Fill #0

## 2017-09-30 DIAGNOSIS — E7800 Pure hypercholesterolemia, unspecified: Secondary | ICD-10-CM | POA: Diagnosis not present

## 2017-09-30 DIAGNOSIS — Z1389 Encounter for screening for other disorder: Secondary | ICD-10-CM | POA: Diagnosis not present

## 2017-09-30 DIAGNOSIS — R7301 Impaired fasting glucose: Secondary | ICD-10-CM | POA: Diagnosis not present

## 2017-09-30 DIAGNOSIS — Z0000 Encounter for general adult medical examination without abnormal findings: Secondary | ICD-10-CM | POA: Diagnosis not present

## 2017-09-30 DIAGNOSIS — I10 Essential (primary) hypertension: Secondary | ICD-10-CM | POA: Diagnosis not present

## 2017-11-18 MED FILL — buPROPion HCL ER (SR) 100 M: 100 | 90 days supply | Qty: 90 | Fill #0

## 2017-11-18 MED FILL — LISINOPRIL 20 MG TABLET: 20 | 90 days supply | Qty: 90 | Fill #1

## 2017-11-18 MED FILL — METOPROLOL SUCCINATE ER 50: 50 | 90 days supply | Qty: 90 | Fill #0

## 2018-01-13 ENCOUNTER — Encounter: Payer: Self-pay | Admitting: Gastroenterology

## 2018-01-22 ENCOUNTER — Other Ambulatory Visit: Payer: Self-pay | Admitting: Cardiology

## 2018-01-26 ENCOUNTER — Telehealth: Payer: Self-pay | Admitting: Pharmacist

## 2018-01-26 DIAGNOSIS — E785 Hyperlipidemia, unspecified: Secondary | ICD-10-CM

## 2018-01-26 NOTE — Telephone Encounter (Signed)
Received reauthorization request for Repatha - pt needs updated lipid panel. LMOM for pt to schedule.

## 2018-01-28 ENCOUNTER — Other Ambulatory Visit: Payer: Self-pay | Admitting: Cardiology

## 2018-02-09 ENCOUNTER — Telehealth: Payer: Self-pay

## 2018-02-09 DIAGNOSIS — I351 Nonrheumatic aortic (valve) insufficiency: Secondary | ICD-10-CM

## 2018-02-09 DIAGNOSIS — I340 Nonrheumatic mitral (valve) insufficiency: Secondary | ICD-10-CM

## 2018-02-09 NOTE — Telephone Encounter (Signed)
Orders placed for repeat yearly echo to be scheduled around 04/2018.

## 2018-02-09 NOTE — Telephone Encounter (Signed)
-----   Message from Sueanne Margarita, MD sent at 02/08/2018  8:54 AM EDT ----- Yes please get an echo for MR  Traci ----- Message ----- From: Sarina Ill, RN Sent: 02/06/2018  10:45 AM EDT To: Sueanne Margarita, MD  Hello, In your note on 2/15, it stated you wanted to get an echo, but I never saw that the test was ordered or completed. I was wondering if you wanted to get that also at the same time as the carotid ultrasound in December.  Thanks, [employer]

## 2018-02-09 NOTE — Addendum Note (Signed)
Addended by: SUPPLE, MEGAN E on: 02/09/2018 04:10 PM   Modules accepted: Orders

## 2018-02-11 ENCOUNTER — Other Ambulatory Visit: Payer: BLUE CROSS/BLUE SHIELD

## 2018-02-13 ENCOUNTER — Encounter: Payer: Self-pay | Admitting: Cardiology

## 2018-02-19 ENCOUNTER — Other Ambulatory Visit: Payer: BLUE CROSS/BLUE SHIELD

## 2018-02-19 DIAGNOSIS — E785 Hyperlipidemia, unspecified: Secondary | ICD-10-CM | POA: Diagnosis not present

## 2018-02-19 LAB — LIPID PANEL
CHOL/HDL RATIO: 3.4 ratio (ref 0.0–5.0)
Cholesterol, Total: 181 mg/dL (ref 100–199)
HDL: 53 mg/dL (ref >39)
LDL Calculated: 105 mg/dL — ABNORMAL HIGH (ref 0–99)
TRIGLYCERIDES: 115 mg/dL (ref 0–149)
VLDL CHOLESTEROL CAL: 23 mg/dL (ref 5–40)

## 2018-02-19 LAB — HEPATIC FUNCTION PANEL
ALK PHOS: 54 IU/L (ref 39–117)
ALT: 23 IU/L (ref 0–44)
AST: 26 IU/L (ref 0–40)
Albumin: 4.5 g/dL (ref 3.6–4.8)
Bilirubin Total: 0.3 mg/dL (ref 0.0–1.2)
Bilirubin, Direct: 0.11 mg/dL (ref 0.00–0.40)
TOTAL PROTEIN: 6.3 g/dL (ref 6.0–8.5)

## 2018-02-23 ENCOUNTER — Ambulatory Visit (AMBULATORY_SURGERY_CENTER): Payer: Self-pay | Admitting: *Deleted

## 2018-02-23 VITALS — Ht 68.0 in | Wt 153.0 lb

## 2018-02-23 DIAGNOSIS — Z1211 Encounter for screening for malignant neoplasm of colon: Secondary | ICD-10-CM

## 2018-02-23 MED ORDER — NA SULFATE-K SULFATE-MG SULF 17.5-3.13-1.6 GM/177ML PO SOLN: ORAL | 0 refills | Status: DC

## 2018-02-23 MED FILL — SUPREP BOWEL PREP KIT: 17.5-3.13-1 | 1 days supply | Qty: 354 | Fill #0

## 2018-02-23 NOTE — Progress Notes (Signed)
Patient denies any allergies to eggs or soy. Patient denies any problems with anesthesia/sedation. Patient denies any oxygen use at home. Patient denies taking any diet/weight loss medications or blood thinners. EMMI education offered, pt declined.  

## 2018-02-24 ENCOUNTER — Encounter: Payer: Self-pay | Admitting: Gastroenterology

## 2018-03-03 ENCOUNTER — Telehealth: Payer: Self-pay | Admitting: Student-PharmD

## 2018-03-03 DIAGNOSIS — E785 Hyperlipidemia, unspecified: Secondary | ICD-10-CM

## 2018-03-03 NOTE — Telephone Encounter (Signed)
Called patient to talk about his increase in LDL while on Repatha. He is due for a renewal of his prior authorization via [place]. No answer, LVM.

## 2018-03-04 NOTE — Telephone Encounter (Signed)
LMOM at home and cell to follow up on change.

## 2018-03-09 ENCOUNTER — Ambulatory Visit (AMBULATORY_SURGERY_CENTER): Payer: BLUE CROSS/BLUE SHIELD | Admitting: Gastroenterology

## 2018-03-09 ENCOUNTER — Encounter: Payer: Self-pay | Admitting: Gastroenterology

## 2018-03-09 VITALS — BP 104/65 | HR 51 | Temp 97.8°F | Resp 13 | Ht 68.0 in | Wt 153.0 lb

## 2018-03-09 DIAGNOSIS — Z1211 Encounter for screening for malignant neoplasm of colon: Secondary | ICD-10-CM | POA: Diagnosis not present

## 2018-03-09 DIAGNOSIS — D128 Benign neoplasm of rectum: Secondary | ICD-10-CM

## 2018-03-09 DIAGNOSIS — K621 Rectal polyp: Secondary | ICD-10-CM | POA: Diagnosis not present

## 2018-03-09 DIAGNOSIS — D120 Benign neoplasm of cecum: Secondary | ICD-10-CM

## 2018-03-09 MED ORDER — SODIUM CHLORIDE 0.9 % IV SOLN: 500.0000 mL | Freq: Once | INTRAVENOUS | Status: DC

## 2018-03-09 MED FILL — METOPROLOL SUCCINATE ER 50: 50 | 90 days supply | Qty: 90 | Fill #1

## 2018-03-09 MED FILL — LISINOPRIL 20 MG TABLET: 20 | 90 days supply | Qty: 90 | Fill #2

## 2018-03-09 MED FILL — buPROPion HCL ER (SR) 100 M: 100 | 90 days supply | Qty: 90 | Fill #1

## 2018-03-09 NOTE — Progress Notes (Signed)
PT taken to PACU. Monitors in place. VSS. Report given to RN. 

## 2018-03-09 NOTE — Patient Instructions (Signed)
Discharge instructions given. Handouts on polyps,diverticulosis and hemorrhoids. Resume previous medications. YOU HAD AN ENDOSCOPIC PROCEDURE TODAY AT THE [facility]:   Refer to the procedure report that was given to you for any specific questions about what was found during the examination.  If the procedure report does not answer your questions, please call your gastroenterologist to clarify.  If you requested that your care partner not be given the details of your procedure findings, then the procedure report has been included in a sealed envelope for you to review at your convenience later.  YOU SHOULD EXPECT: Some feelings of bloating in the abdomen. Passage of more gas than usual.  Walking can help get rid of the air that was put into your GI tract during the procedure and reduce the bloating. If you had a lower endoscopy (such as a colonoscopy or flexible sigmoidoscopy) you may notice spotting of blood in your stool or on the toilet paper. If you underwent a bowel prep for your procedure, you may not have a normal bowel movement for a few days.  Please Note:  You might notice some irritation and congestion in your nose or some drainage.  This is from the oxygen used during your procedure.  There is no need for concern and it should clear up in a day or so.  SYMPTOMS TO REPORT IMMEDIATELY:   Following lower endoscopy (colonoscopy or flexible sigmoidoscopy):  Excessive amounts of blood in the stool  Significant tenderness or worsening of abdominal pains  Swelling of the abdomen that is new, acute  Fever of 100F or higher   For urgent or emergent issues, a gastroenterologist can be reached at any hour by calling [phone].   DIET:  We do recommend a small meal at first, but then you may proceed to your regular diet.  Drink plenty of fluids but you should avoid alcoholic beverages for 24 hours.  ACTIVITY:  You should plan to take it easy for the rest of today and you  should NOT DRIVE or use heavy machinery until tomorrow (because of the sedation medicines used during the test).    FOLLOW UP: Our staff will call the number listed on your records the next business day following your procedure to check on you and address any questions or concerns that you may have regarding the information given to you following your procedure. If we do not reach you, we will leave a message.  However, if you are feeling well and you are not experiencing any problems, there is no need to return our call.  We will assume that you have returned to your regular daily activities without incident.  If any biopsies were taken you will be contacted by phone or by letter within the next 1-3 weeks.  Please call us at [phone] if you have not heard about the biopsies in 3 weeks.    SIGNATURES/CONFIDENTIALITY: You and/or your care partner have signed paperwork which will be entered into your electronic medical record.  These signatures attest to the fact that that the information above on your After Visit Summary has been reviewed and is understood.  Full responsibility of the confidentiality of this discharge information lies with you and/or your care-partner. 

## 2018-03-09 NOTE — Telephone Encounter (Signed)
Spoke with patient who states that he has been taking Repatha as prescribed. He does endorse that he spent a few weeks with his son in [place] and ate red meat and eggs. He does not usually eat red meat or eggs. He believes that the trend up in his cholesterol is related to dietary indiscretions rather than the medication. He would like to remain on Repatha for now and repeat labs before visit with Dr. Radford Pax in Feb. If LDL not at goal at this time will consider change to Praluent to see if more efficacious.   Will send for reauth of repatha today as coverage will expire in December.

## 2018-03-09 NOTE — Progress Notes (Signed)
Called to room to assist during endoscopic procedure.  Patient ID and intended procedure confirmed with present staff. Received instructions for my participation in the procedure from the performing physician.  

## 2018-03-09 NOTE — Telephone Encounter (Signed)
LMOM to follow up. Appears pt having procedure today will try again tomorrow.

## 2018-03-09 NOTE — Progress Notes (Signed)
Pt's states no medical or surgical changes since previsit or office visit. 

## 2018-03-09 NOTE — Op Note (Signed)
[facility] Patient Name: Travis Harrison Procedure Date: 03/09/2018 8:04 AM MRN: [id number] Endoscopist: Mauri Pole , MD Age: 67 Referring MD:  Date of Birth: 01-Sep-1950 Gender: Male Account #: [id number] Procedure:                Colonoscopy Indications:              Screening for colorectal malignant neoplasm, Last                            colonoscopy 10 years ago ([facility], normal per                            patient, report not available) Medicines:                Monitored Anesthesia Care Procedure:                Pre-Anesthesia Assessment:                           - Prior to the procedure, a History and Physical                            was performed, and patient medications and                            allergies were reviewed. The patient's tolerance of                            previous anesthesia was also reviewed. The risks                            and benefits of the procedure and the sedation                            options and risks were discussed with the patient.                            All questions were answered, and informed consent                            was obtained. Prior Anticoagulants: The patien                           t has taken no previous anticoagulant or                            antiplatelet agents. ASA Grade Assessment: III - A                            patient with severe systemic disease. After                            reviewing the risks and benefits, the patient was  deemed in satisfactory condition to undergo the                            procedure.                           After obtaining informed consent, the colonoscope                            was passed under direct vision. Throughout the                            procedure, the patient's blood pressure, pulse, and                            oxygen saturations were monitored continuously. The     Model PCF-H190DL [id number]) scope was introduced                            through the anus and advanced to the the terminal                            ileum, with identification of the appendiceal                            orifice and IC valve. The colonoscopy was performed                            without difficulty. The patient tolerated the                            procedure well. The quality of the bowel                            preparation was excellent. The terminal ileum,                            ileocecal valve, appendiceal orifice, and rectum                            were photographed. Scope In: 8:07:34 AM Scope Out: 8:19:05 AM Scope Withdrawal Time: 0 hours 7 minutes 32 seconds  Total Procedure Duration: 0 hours 11 minutes 31 seconds  Findings:                 The perianal and digital rectal examinations were                            normal.                           A 3 mm polyp was found in the cecum. The polyp was                            sessile. The polyp was removed with a cold snare.  Resection and retrieval were complete.                           A 1 mm polyp was found in the rectum. The polyp was                            sessile. The polyp was removed with a cold biopsy                            forceps. Resection and retrieval were complete.                           Scattered small-mouthed diverticula were found in                            the sigmoid colon, descending colon, transverse                            colon and ascending colon.                           Non-bleeding internal hemorrhoids were found during                            retroflexion. The hemorrhoids were large. Complications:            No immediate complications. Estimated Blood Loss:     Estimated blood loss was minimal. Impression:               - One 3 mm polyp in the cecum, removed with a cold                            snare. Resected  and retrieved.                           - One 1 mm polyp in the rectum, removed with a cold                            biopsy forceps. Resected and retrieved.                           - Diverticulosis in the sigmoid colon, in the                            descending colon, in the transverse colon and in                            the ascending colon.                           - Non-bleeding internal hemorrhoids. Recommendation:           - Patient has a contact number available for  emergencies. The signs and symptoms of potential                            delayed complications were discussed with the                            patient. Return to normal activities tomorrow.                            Written discharge instructions were provided to the                            patient.                           - Resume previous diet.                           - Continue present medications.                           - Await pathology results.                           - Repeat colonoscopy in 5-10 years for surveillance                            based on pathology results. Mauri Pole, MD 03/09/2018 8:23:50 AM This report has been signed electronically.

## 2018-03-10 ENCOUNTER — Telehealth: Payer: Self-pay

## 2018-03-10 NOTE — Telephone Encounter (Signed)
  Follow up Call-  Call back number 03/09/2018  Post procedure Call Back phone  # [phone]  Permission to leave phone message Yes  Some recent data might be hidden     Patient questions:  Do you have a fever, pain , or abdominal swelling? No. Pain Score  0 *  Have you tolerated food without any problems? Yes.    Have you been able to return to your normal activities? Yes.    Do you have any questions about your discharge instructions: Diet   No. Medications  No. Follow up visit  No.  Do you have questions or concerns about your Care? No.  Actions: * If pain score is 4 or above: No action needed, pain <4.

## 2018-03-12 ENCOUNTER — Encounter: Payer: Self-pay | Admitting: Gastroenterology

## 2018-04-09 ENCOUNTER — Encounter (HOSPITAL_COMMUNITY): Payer: BLUE CROSS/BLUE SHIELD

## 2018-04-23 ENCOUNTER — Other Ambulatory Visit: Payer: BLUE CROSS/BLUE SHIELD

## 2018-04-28 ENCOUNTER — Encounter: Payer: Self-pay | Admitting: Cardiology

## 2018-04-28 ENCOUNTER — Other Ambulatory Visit: Payer: Self-pay

## 2018-04-28 ENCOUNTER — Ambulatory Visit (INDEPENDENT_AMBULATORY_CARE_PROVIDER_SITE_OTHER): Payer: BLUE CROSS/BLUE SHIELD

## 2018-04-28 DIAGNOSIS — I340 Nonrheumatic mitral (valve) insufficiency: Secondary | ICD-10-CM

## 2018-04-28 DIAGNOSIS — I6523 Occlusion and stenosis of bilateral carotid arteries: Secondary | ICD-10-CM

## 2018-04-28 DIAGNOSIS — I351 Nonrheumatic aortic (valve) insufficiency: Secondary | ICD-10-CM

## 2018-04-30 ENCOUNTER — Telehealth: Payer: Self-pay

## 2018-04-30 NOTE — Telephone Encounter (Signed)
-----   Message from Sueanne Margarita, MD sent at 04/28/2018  6:23 PM EST ----- Please let patient know that echo showed mildly thickened heart muscle with normal LVF and mildly leaky and calcified AV and mildly enlarged RV and aortic root - repeat echo in 1 year

## 2018-04-30 NOTE — Telephone Encounter (Signed)
LMTCB

## 2018-05-01 ENCOUNTER — Other Ambulatory Visit: Payer: Self-pay

## 2018-05-01 DIAGNOSIS — E785 Hyperlipidemia, unspecified: Secondary | ICD-10-CM

## 2018-05-01 DIAGNOSIS — I351 Nonrheumatic aortic (valve) insufficiency: Secondary | ICD-10-CM

## 2018-05-01 DIAGNOSIS — I340 Nonrheumatic mitral (valve) insufficiency: Secondary | ICD-10-CM

## 2018-05-01 DIAGNOSIS — I10 Essential (primary) hypertension: Secondary | ICD-10-CM

## 2018-05-01 DIAGNOSIS — I2510 Atherosclerotic heart disease of native coronary artery without angina pectoris: Secondary | ICD-10-CM

## 2018-05-01 DIAGNOSIS — I6523 Occlusion and stenosis of bilateral carotid arteries: Secondary | ICD-10-CM

## 2018-05-01 NOTE — Progress Notes (Signed)
Orders put in for follow up echo and carotid U/S for 1 year (2021)

## 2018-05-08 ENCOUNTER — Encounter: Payer: Self-pay | Admitting: Cardiology

## 2018-05-20 ENCOUNTER — Other Ambulatory Visit: Payer: BLUE CROSS/BLUE SHIELD | Admitting: *Deleted

## 2018-05-20 DIAGNOSIS — E785 Hyperlipidemia, unspecified: Secondary | ICD-10-CM | POA: Diagnosis not present

## 2018-05-20 LAB — HEPATIC FUNCTION PANEL
ALBUMIN: 4.1 g/dL (ref 3.8–4.8)
ALT: 25 IU/L (ref 0–44)
AST: 28 IU/L (ref 0–40)
Alkaline Phosphatase: 52 IU/L (ref 39–117)
Bilirubin Total: 0.3 mg/dL (ref 0.0–1.2)
Bilirubin, Direct: 0.1 mg/dL (ref 0.00–0.40)
TOTAL PROTEIN: 6.1 g/dL (ref 6.0–8.5)

## 2018-05-20 LAB — LIPID PANEL
CHOL/HDL RATIO: 2.9 ratio (ref 0.0–5.0)
Cholesterol, Total: 155 mg/dL (ref 100–199)
HDL: 53 mg/dL (ref >39)
LDL CALC: 85 mg/dL (ref 0–99)
TRIGLYCERIDES: 83 mg/dL (ref 0–149)
VLDL CHOLESTEROL CAL: 17 mg/dL (ref 5–40)

## 2018-05-22 ENCOUNTER — Telehealth: Payer: Self-pay | Admitting: Pharmacist

## 2018-05-22 NOTE — Telephone Encounter (Signed)
Called and spoke with patient about the results of his lipid panel. LDL is not at goal. Patient had one lab with elevated LFT while on higher doses of atorvastatin. I discussed the benefit of statin therapy with the patient and his goal LDL. Explained that there are some types of statins that are less likely to cause elevated LFT. I would recommend starting low dose rosuvastatin 5mg  daily. Or resume zetia which he was on in the past. (ideally starting the statin). Patient states that he is not interested in adding any medications and that he is happy with his numbers. States the LDL the time before last was because he was on vacation. But stated he had an appointment with Dr. Radford Pax next week and would think about it until then.   One other option not discussed with patient was to switch to Praluent 150mg , but I am not very confident that this will make a big difference.

## 2018-05-25 NOTE — Progress Notes (Signed)
Cardiology Office Note:    Date:  05/26/2018   ID:  WEST BOOMERSHINE, DOB Sep 03, 1950, MRN [id number]  PCP:  Wenda Low, MD  Cardiologist:  Fransico Him, MD    Referring MD: Wenda Low, MD   Chief Complaint  Patient presents with  . Coronary Artery Disease  . Hypertension  . Hyperlipidemia    History of Present Illness:    Travis Harrison is a 68 y.o. male with a hx of HTN, dyslipidemia, moderate carotid artery stenosis and ASCAD s/p CABG 2015 with a L-LAD and RIMA-OM1. Pre-op carotid US demonstrated RICA 29-92% and LICA 4-26%.  He is here today for followup and is doing well.  He denies any chest pain or pressure, SOB, DOE, PND, orthopnea, LE edema, dizziness, palpitations or syncope. He is compliant with his meds and is tolerating meds with no SE.    Past Medical History:  Diagnosis Date  . Carotid artery stenosis, asymptomatic    Carotid dopplers showed minimal proximal ICA plaque and < 50% bilateral common carotid artery plaque   . Dilated aortic root ([place]) 05/26/2018   38 mm by echo 05/11/2018  . Hyperlipidemia   . Hypertension   . 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 CARDIAC CATHETERIZATION    . COLONOSCOPY  2009   AT [place]=normal exam per pt  . CORONARY ARTERY BYPASS GRAFT N/A 03/04/2014   Procedure: CORONARY ARTERY BYPASS GRAFTING (CABG);  Surgeon: Rexene Alberts, MD;  Location: [place];  Service: Open Heart Surgery;  Laterality: N/A;  . INTRAOPERATIVE TRANSESOPHAGEAL ECHOCARDIOGRAM N/A 03/04/2014   Procedure: INTRAOPERATIVE TRANSESOPHAGEAL ECHOCARDIOGRAM;  Surgeon: Rexene Alberts, MD;  Location: [place];  Service: Open Heart Surgery;  Laterality: N/A;  . KNEE ARTHROSCOPY Bilateral 2018   bil.  Marland Kitchen LEFT HEART CATHETERIZATION WITH CORONARY ANGIOGRAM N/A 03/03/2014   Procedure: LEFT HEART CATHETERIZATION WITH CORONARY ANGIOGRAM;  Surgeon: Blane Ohara, MD;  Location: [facility] CATH LAB;  Service:  Cardiovascular;  Laterality: N/A;    Current Medications: Current Meds  Medication Sig  . aspirin 81 MG EC tablet Take 1 tablet (81 mg total) by mouth daily.  Marland Kitchen buPROPion (WELLBUTRIN SR) 100 MG 12 hr tablet Take 100 mg by mouth daily.  Marland Kitchen lisinopril (PRINIVIL,ZESTRIL) 20 MG tablet TAKE 1 TABLET BY MOUTH ONCE DAILY  . metoprolol tartrate (LOPRESSOR) 25 MG tablet Take 0.5 tablets (12.5 mg total) by mouth 2 (two) times daily.  Marland Kitchen REPATHA SURECLICK 834 MG/ML SOAJ INJECT 140 MG SUBCUTANEOUSLY EVERY 14 DAYS     Allergies:   Patient has no known allergies.   Social History   Socioeconomic History  . Marital status: Divorced    Spouse name: Not on file  . Number of children: Not on file  . Years of education: Not on file  . Highest education level: Not on file  Occupational History  . Not on file  Social Needs  . Financial resource strain: Not on file  . Food insecurity:    Worry: Not on file    Inability: Not on file  . Transportation needs:    Medical: Not on file    Non-medical: Not on file  Tobacco Use  . Smoking status: Former Smoker    Last attempt to quit: 03/04/1999    Years since quitting: 19.2  . Smokeless tobacco: Never Used  Substance and Sexual Activity  . Alcohol use:  Yes    Alcohol/week: 6.0 standard drinks    Types: 6 Cans of beer per week  . Drug use: Not Currently  . Sexual activity: Not on file  Lifestyle  . Physical activity:    Days per week: Not on file    Minutes per session: Not on file  . Stress: Not on file  Relationships  . Social connections:    Talks on phone: Not on file    Gets together: Not on file    Attends religious service: Not on file    Active member of club or organization: Not on file    Attends meetings of clubs or organizations: Not on file    Relationship status: Not on file  Other Topics Concern  . Not on file  Social History Narrative   Married and lives with his wife.  Works for [employer].  Physically  active, walks 2 miles daily.     Family History: The patient's family history includes CVA in his father; Heart attack in his mother; Heart attack (age of onset: 41) in his father; Heart disease in his father and mother; Hypertension in his father; Stomach cancer in his mother. There is no history of Colon cancer, Colon polyps, Esophageal cancer, or Rectal cancer.  ROS:   Please see the history of present illness.    ROS  All other systems reviewed and negative.   EKGs/Labs/Other Studies Reviewed:    The following studies were reviewed today: none  EKG:  EKG is  ordered today.  The ekg ordered today demonstrates sinus bradycardia 53 bpm with minimal criteria for LVH no ST changes  Recent Labs: 06/11/2017: BUN 12; Creatinine, Ser 0.93; Potassium 4.5; Sodium 140 05/20/2018: ALT 25   Recent Lipid Panel    Component Value Date/Time   CHOL 155 05/20/2018 0000   TRIG 83 05/20/2018 0000   HDL 53 05/20/2018 0000   CHOLHDL 2.9 05/20/2018 0000   CHOLHDL 2.9 03/21/2016 0730   VLDL 15 03/21/2016 0730   LDLCALC 85 05/20/2018 0000   LDLDIRECT 87.0 05/18/2014 0912    Physical Exam:    VS:  BP (!) 152/88   Pulse (!) 56   Ht 5\' 8"  (1.727 m)   Wt 154 lb (69.9 kg)   BMI 23.42 kg/m     Wt Readings from Last 3 Encounters:  05/26/18 154 lb (69.9 kg)  03/09/18 153 lb (69.4 kg)  02/23/18 153 lb (69.4 kg)     GEN:  Well nourished, well developed in no acute distress HEENT: Normal NECK: No JVD; No carotid bruits LYMPHATICS: No lymphadenopathy CARDIAC: RRR, no murmurs, rubs, gallops RESPIRATORY:  Clear to auscultation without rales, wheezing or rhonchi  ABDOMEN: Soft, non-tender, non-distended MUSCULOSKELETAL:  No edema; No deformity  SKIN: Warm and dry NEUROLOGIC:  Alert and oriented x 3 PSYCHIATRIC:  Normal affect   ASSESSMENT:    1. Coronary artery disease involving native coronary artery of native heart without angina pectoris   2. Essential hypertension   3. Asymptomatic  bilateral carotid artery stenosis   4. Hyperlipidemia with target LDL less than 70   5. Dilated aortic root (HCC)    PLAN:    In order of problems listed above:  1.  ASCAD- s/p CABG 2015 with a L-LAD and RIMA-OM1.  He denies any anginal sx.  He will continue on ASA 81mg  daily, BB and Repatha.  He is statin intolerant.  2.  HTN - BP is borderline controlled on exam  today.  He says his blood pressure always gets higher when he comes to the office.  I have asked him to check his blood pressure once a day for a week and call me with the results.  He will continue on Lisinopril 20mg  daily and Lopressor 12.5mg  BID.  3.  Carotid artery stenosis - dopplers 04/2018 showed < 50% bilateral CCA stenosis - continue Repatha and ASA. Repeat dopplers in 1 year.   4.  Hyperlipidemia - LDL goal is < 70.  Continue Repatha.  He is statin intolerant.  It was recommended by lipid clinic to restart Zetia or start low dose Crestor as LDL is not at goal.  Patient refused at that time he had just come back from a trip where he had not eaten well.  We are going to continue with current medical therapy and recheck an FLP and ALT in 4 months.  5.  Dilated aortic root -his aortic root dimension was 38 mm on recent echo.  I will repeat an echo in 1 year to make sure this is not progressed.  Continue aggressive blood pressure control and statin.   Medication Adjustments/Labs and Tests Ordered: Current medicines are reviewed at length with the patient today.  Concerns regarding medicines are outlined above.  No orders of the defined types were placed in this encounter.  No orders of the defined types were placed in this encounter.   Signed, Fransico Him, MD  05/26/2018 8:13 AM    [place]

## 2018-05-26 ENCOUNTER — Encounter: Payer: Self-pay | Admitting: Cardiology

## 2018-05-26 ENCOUNTER — Ambulatory Visit: Payer: BLUE CROSS/BLUE SHIELD | Admitting: Cardiology

## 2018-05-26 VITALS — BP 152/88 | HR 56 | Ht 68.0 in | Wt 154.0 lb

## 2018-05-26 DIAGNOSIS — I6523 Occlusion and stenosis of bilateral carotid arteries: Secondary | ICD-10-CM | POA: Diagnosis not present

## 2018-05-26 DIAGNOSIS — I7121 Aneurysm of the ascending aorta, without rupture: Secondary | ICD-10-CM

## 2018-05-26 DIAGNOSIS — I77810 Thoracic aortic ectasia: Secondary | ICD-10-CM

## 2018-05-26 DIAGNOSIS — E785 Hyperlipidemia, unspecified: Secondary | ICD-10-CM | POA: Diagnosis not present

## 2018-05-26 DIAGNOSIS — I10 Essential (primary) hypertension: Secondary | ICD-10-CM

## 2018-05-26 DIAGNOSIS — I712 Thoracic aortic aneurysm, without rupture: Secondary | ICD-10-CM

## 2018-05-26 DIAGNOSIS — I2510 Atherosclerotic heart disease of native coronary artery without angina pectoris: Secondary | ICD-10-CM | POA: Diagnosis not present

## 2018-05-26 HISTORY — DX: Thoracic aortic ectasia: I77.810

## 2018-05-26 HISTORY — DX: Aneurysm of the ascending aorta, without rupture: I71.21

## 2018-05-26 HISTORY — DX: Thoracic aortic aneurysm, without rupture: I71.2

## 2018-05-26 NOTE — Patient Instructions (Addendum)
Medication Instructions:  Your physician recommends that you continue on your current medications as directed. Please refer to the Current Medication list given to you today.  If you need a refill on your cardiac medications before your next appointment, please call your pharmacy.   Lab work: Future: 09/24/18 If you have labs (blood work) drawn today and your tests are completely normal, you will receive your results only by: Marland Kitchen MyChart Message (if you have MyChart) OR . A paper copy in the mail If you have any lab test that is abnormal or we need to change your treatment, we will call you to review the results.  Testing/Procedures: Your physician has requested that you have an echocardiogram around 05/27/19. Echocardiography is a painless test that uses sound waves to create images of your heart. It provides your doctor with information about the size and shape of your heart and how well your heart's chambers and valves are working. This procedure takes approximately one hour. There are no restrictions for this procedure.  Your physician has requested that you have a carotid duplex, around 05/27/19. This test is an ultrasound of the carotid arteries in your neck. It looks at blood flow through these arteries that supply the brain with blood. Allow one hour for this exam. There are no restrictions or special instructions.  Follow-Up: At [facility], you and your health needs are our priority.  As part of our continuing mission to provide you with exceptional heart care, we have created designated Provider Care Teams.  These Care Teams include your primary Cardiologist (physician) and Advanced Practice Providers (APPs -  Physician Assistants and Nurse Practitioners) who all work together to provide you with the care you need, when you need it. You will need a follow up appointment in 1 years.  Please call our office 2 months in advance to schedule this appointment.  You may see Fransico Him, MD or one  of the following Advanced Practice Providers on your designated Care Team:   [place], PA-C Melina Copa, PA-C . Ermalinda Barrios, PA-C

## 2018-06-21 MED FILL — buPROPion HCL ER (SR) 100 M: 100 | 90 days supply | Qty: 90 | Fill #2

## 2018-06-21 MED FILL — LISINOPRIL 20 MG TABLET: 20 | 90 days supply | Qty: 90 | Fill #3

## 2018-06-22 MED FILL — METOPROLOL SUCCINATE ER 50: 50 | 90 days supply | Qty: 90 | Fill #2

## 2018-09-21 ENCOUNTER — Other Ambulatory Visit: Payer: Self-pay | Admitting: Cardiology

## 2018-09-21 MED ORDER — LISINOPRIL 20 MG PO TABS: 20.0000 mg | ORAL_TABLET | Freq: Every day | ORAL | 3 refills | Status: DC

## 2018-09-21 MED FILL — LISINOPRIL 20 MG TABLET: 20 | 90 days supply | Qty: 90 | Fill #0

## 2018-09-21 MED FILL — buPROPion HCL ER (SR) 100 M: 100 | 90 days supply | Qty: 90 | Fill #3

## 2018-09-21 MED FILL — METOPROLOL SUCCINATE ER 50: 50 | 90 days supply | Qty: 90 | Fill #3

## 2018-09-24 ENCOUNTER — Other Ambulatory Visit: Payer: BLUE CROSS/BLUE SHIELD

## 2018-10-07 ENCOUNTER — Other Ambulatory Visit: Payer: BC Managed Care – PPO | Admitting: *Deleted

## 2018-10-07 ENCOUNTER — Other Ambulatory Visit: Payer: Self-pay

## 2018-10-07 DIAGNOSIS — E785 Hyperlipidemia, unspecified: Secondary | ICD-10-CM

## 2018-10-07 DIAGNOSIS — Z0000 Encounter for general adult medical examination without abnormal findings: Secondary | ICD-10-CM | POA: Diagnosis not present

## 2018-10-07 DIAGNOSIS — N402 Nodular prostate without lower urinary tract symptoms: Secondary | ICD-10-CM | POA: Diagnosis not present

## 2018-10-07 DIAGNOSIS — I10 Essential (primary) hypertension: Secondary | ICD-10-CM | POA: Diagnosis not present

## 2018-10-07 DIAGNOSIS — R7309 Other abnormal glucose: Secondary | ICD-10-CM | POA: Diagnosis not present

## 2018-10-07 LAB — LIPID PANEL
Chol/HDL Ratio: 2.6 ratio (ref 0.0–5.0)
Cholesterol, Total: 147 mg/dL (ref 100–199)
HDL: 56 mg/dL (ref >39)
LDL Calculated: 72 mg/dL (ref 0–99)
Triglycerides: 95 mg/dL (ref 0–149)
VLDL Cholesterol Cal: 19 mg/dL (ref 5–40)

## 2018-10-07 LAB — HEPATIC FUNCTION PANEL
ALT: 26 IU/L (ref 0–44)
AST: 29 IU/L (ref 0–40)
Albumin: 4.3 g/dL (ref 3.8–4.8)
Alkaline Phosphatase: 52 IU/L (ref 39–117)
Bilirubin Total: 0.3 mg/dL (ref 0.0–1.2)
Bilirubin, Direct: 0.1 mg/dL (ref 0.00–0.40)
Total Protein: 5.9 g/dL — ABNORMAL LOW (ref 6.0–8.5)

## 2018-12-03 DIAGNOSIS — K1370 Unspecified lesions of oral mucosa: Secondary | ICD-10-CM | POA: Diagnosis not present

## 2018-12-21 DIAGNOSIS — K099 Cyst of oral region, unspecified: Secondary | ICD-10-CM | POA: Diagnosis not present

## 2018-12-22 MED FILL — buPROPion HCL ER (SR) 100 M: 100 | 90 days supply | Qty: 90 | Fill #0

## 2018-12-22 MED FILL — LISINOPRIL 20 MG TABLET: 20 | 90 days supply | Qty: 90 | Fill #0

## 2018-12-22 MED FILL — METOPROLOL SUCCINATE ER 50: 50 | 90 days supply | Qty: 90 | Fill #0

## 2019-01-24 ENCOUNTER — Other Ambulatory Visit: Payer: Self-pay | Admitting: Cardiology

## 2019-01-26 DIAGNOSIS — K099 Cyst of oral region, unspecified: Secondary | ICD-10-CM | POA: Diagnosis not present

## 2019-01-26 MED FILL — AMPICILLIN TR 500 MG CAP: 500 | 7 days supply | Qty: 28 | Fill #0

## 2019-01-26 MED FILL — HYDROCODON-APAP 5-325: 5-325 | 2 days supply | Qty: 14 | Fill #0

## 2019-03-23 MED FILL — METOPROLOL SUCCINATE ER 50: 50 | 90 days supply | Qty: 90 | Fill #1

## 2019-03-23 MED FILL — LISINOPRIL 20 MG TABLET: 20 | 90 days supply | Qty: 90 | Fill #1

## 2019-03-23 MED FILL — buPROPion HCL ER (SR) 100 M: 100 | 90 days supply | Qty: 90 | Fill #1

## 2019-04-21 ENCOUNTER — Other Ambulatory Visit: Payer: Self-pay | Admitting: Cardiology

## 2019-04-21 DIAGNOSIS — I351 Nonrheumatic aortic (valve) insufficiency: Secondary | ICD-10-CM

## 2019-04-21 DIAGNOSIS — I6523 Occlusion and stenosis of bilateral carotid arteries: Secondary | ICD-10-CM

## 2019-04-21 DIAGNOSIS — I340 Nonrheumatic mitral (valve) insufficiency: Secondary | ICD-10-CM

## 2019-05-04 ENCOUNTER — Other Ambulatory Visit: Payer: Self-pay

## 2019-05-04 ENCOUNTER — Ambulatory Visit (INDEPENDENT_AMBULATORY_CARE_PROVIDER_SITE_OTHER): Payer: BC Managed Care – PPO

## 2019-05-04 ENCOUNTER — Encounter: Payer: Self-pay | Admitting: Cardiology

## 2019-05-04 DIAGNOSIS — I340 Nonrheumatic mitral (valve) insufficiency: Secondary | ICD-10-CM | POA: Diagnosis not present

## 2019-05-04 DIAGNOSIS — I351 Nonrheumatic aortic (valve) insufficiency: Secondary | ICD-10-CM

## 2019-05-04 DIAGNOSIS — I6523 Occlusion and stenosis of bilateral carotid arteries: Secondary | ICD-10-CM | POA: Diagnosis not present

## 2019-05-05 ENCOUNTER — Encounter: Payer: Self-pay | Admitting: Cardiology

## 2019-05-05 DIAGNOSIS — I359 Nonrheumatic aortic valve disorder, unspecified: Secondary | ICD-10-CM | POA: Insufficient documentation

## 2019-05-06 ENCOUNTER — Telehealth: Payer: Self-pay

## 2019-05-06 DIAGNOSIS — I712 Thoracic aortic aneurysm, without rupture, unspecified: Secondary | ICD-10-CM

## 2019-05-06 NOTE — Telephone Encounter (Signed)
-----   Message from Sueanne Margarita, MD sent at 05/05/2019  3:07 PM EST ----- 2D echo showed normal heart function with milldy thickened heart muscle and increased stiffness of heart muscle, midly reduced and enlarged right ventricle, mildly calcified MV, mild to moderately leaky AV and mild stenosis of the AV.  The ascending aorta is mildly dilated.  Please get chest CTA to assess aortic aneurysm further, and 2D echo in 1 year for followup of AV.  He will need a BMET prior to CT

## 2019-05-12 ENCOUNTER — Other Ambulatory Visit: Payer: Self-pay

## 2019-05-12 DIAGNOSIS — E785 Hyperlipidemia, unspecified: Secondary | ICD-10-CM

## 2019-05-20 ENCOUNTER — Other Ambulatory Visit: Payer: BC Managed Care – PPO | Admitting: *Deleted

## 2019-05-20 ENCOUNTER — Other Ambulatory Visit: Payer: Self-pay

## 2019-05-20 DIAGNOSIS — E785 Hyperlipidemia, unspecified: Secondary | ICD-10-CM

## 2019-05-20 DIAGNOSIS — I712 Thoracic aortic aneurysm, without rupture, unspecified: Secondary | ICD-10-CM

## 2019-05-20 LAB — BASIC METABOLIC PANEL
BUN/Creatinine Ratio: 23 (ref 10–24)
BUN: 24 mg/dL (ref 8–27)
CO2: 24 mmol/L (ref 20–29)
Calcium: 9.4 mg/dL (ref 8.6–10.2)
Chloride: 102 mmol/L (ref 96–106)
Creatinine, Ser: 1.03 mg/dL (ref 0.76–1.27)
GFR calc Af Amer: 85 mL/min/{1.73_m2} (ref >59)
GFR calc non Af Amer: 73 mL/min/{1.73_m2} (ref >59)
Glucose: 89 mg/dL (ref 65–99)
Potassium: 4.8 mmol/L (ref 3.5–5.2)
Sodium: 139 mmol/L (ref 134–144)

## 2019-05-20 LAB — LIPID PANEL
Chol/HDL Ratio: 2.7 ratio (ref 0.0–5.0)
Cholesterol, Total: 144 mg/dL (ref 100–199)
HDL: 54 mg/dL (ref >39)
LDL Chol Calc (NIH): 75 mg/dL (ref 0–99)
Triglycerides: 75 mg/dL (ref 0–149)
VLDL Cholesterol Cal: 15 mg/dL (ref 5–40)

## 2019-05-20 LAB — ALT: ALT: 18 IU/L (ref 0–44)

## 2019-05-25 ENCOUNTER — Ambulatory Visit (INDEPENDENT_AMBULATORY_CARE_PROVIDER_SITE_OTHER)
Admission: RE | Admit: 2019-05-25 | Discharge: 2019-05-25 | Disposition: A | Discharge status: Home / Self Care | Payer: BC Managed Care – PPO | Source: Ambulatory Visit | Attending: Cardiology | Admitting: Cardiology

## 2019-05-25 ENCOUNTER — Other Ambulatory Visit: Payer: Self-pay

## 2019-05-25 DIAGNOSIS — I712 Thoracic aortic aneurysm, without rupture, unspecified: Secondary | ICD-10-CM

## 2019-05-25 MED ORDER — IOHEXOL 350 MG/ML SOLN
100.0000 mL | Freq: Once | INTRAVENOUS | Status: AC | PRN
  Administered 2019-05-25: 09:00:00 100 mL via INTRAVENOUS

## 2019-05-26 ENCOUNTER — Telehealth: Payer: Self-pay

## 2019-05-26 DIAGNOSIS — I712 Thoracic aortic aneurysm, without rupture, unspecified: Secondary | ICD-10-CM

## 2019-05-26 DIAGNOSIS — I77810 Thoracic aortic ectasia: Secondary | ICD-10-CM

## 2019-05-26 NOTE — Telephone Encounter (Signed)
-----   Message from Sueanne Margarita, MD sent at 05/25/2019  5:17 PM EST ----- Chest CTA shows ascending thoracic aortic aneurysm at 83mm.  Repeat chest CTA in 1 year to followup on aneurysm. Please have patient check his BP daily for a week at lunch and call with results.  If elevated we will need to increase ACE I.  BP goal < 130/43mmHg. Continue REpatha

## 2019-06-02 NOTE — Progress Notes (Signed)
Cardiology Office Note:    Date:  06/03/2019   ID:  Travis Harrison, DOB 1950/11/22, MRN [id number]  PCP:  Wenda Low, MD  Cardiologist:  Fransico Him, MD    Referring MD: Wenda Low, MD   Chief Complaint  Patient presents with  . Coronary Artery Disease  . Hyperlipidemia    History of Present Illness:    Travis Harrison is a 69 y.o. male with a hx of HTN, dyslipidemia, moderate carotid artery stenosis and ASCAD s/p CABG 2015 with a L-LAD and RIMA-OM1. Pre-op carotid US demonstrated RICA 123456 and LICA 123456.   He is here today for followup and is doing well.  He2 denies any chest pain or pressure, SOB, DOE, PND, orthopnea, LE edema, dizziness, palpitations or syncope. He is compliant with his meds and is tolerating meds with no SE.    Past Medical History:  Diagnosis Date  . Aortic valve disease    mild to moderate AR and mild AS by echo 04/2019  . Ascending aortic aneurysm ([place]) 05/26/2018   33mm by echo 04/2019  . Carotid artery stenosis, asymptomatic    1-39% bilateral stenosis by dopplers 04/2019  . Hyperlipidemia   . Hypertension   . 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 CARDIAC CATHETERIZATION    . COLONOSCOPY  2009   AT [place]=normal exam per pt  . CORONARY ARTERY BYPASS GRAFT N/A 03/04/2014   Procedure: CORONARY ARTERY BYPASS GRAFTING (CABG);  Surgeon: Rexene Alberts, MD;  Location: [place];  Service: Open Heart Surgery;  Laterality: N/A;  . INTRAOPERATIVE TRANSESOPHAGEAL ECHOCARDIOGRAM N/A 03/04/2014   Procedure: INTRAOPERATIVE TRANSESOPHAGEAL ECHOCARDIOGRAM;  Surgeon: Rexene Alberts, MD;  Location: [place];  Service: Open Heart Surgery;  Laterality: N/A;  . KNEE ARTHROSCOPY Bilateral 2018   bil.  Marland Kitchen LEFT HEART CATHETERIZATION WITH CORONARY ANGIOGRAM N/A 03/03/2014   Procedure: LEFT HEART CATHETERIZATION WITH CORONARY ANGIOGRAM;  Surgeon: Blane Ohara, MD;  Location[facility] CATH LAB;   Service: Cardiovascular;  Laterality: N/A;    Current Medications: Current Meds  Medication Sig  . aspirin 81 MG EC tablet Take 1 tablet (81 mg total) by mouth daily.  Marland Kitchen buPROPion (WELLBUTRIN SR) 100 MG 12 hr tablet Take 100 mg by mouth daily.  Marland Kitchen lisinopril (ZESTRIL) 20 MG tablet Take 1 tablet (20 mg total) by mouth daily.  . metoprolol tartrate (LOPRESSOR) 25 MG tablet Take 0.5 tablets (12.5 mg total) by mouth 2 (two) times daily.  Marland Kitchen REPATHA SURECLICK [id number] MG/ML SOAJ INJECT 140 MG SUBCUTANEOUSLY EVERY 14 DAYS     Allergies:   Patient has no known allergies.   Social History   Socioeconomic History  . Marital status: Divorced    Spouse name: Not on file  . Number of children: Not on file  . Years of education: Not on file  . Highest education level: Not on file  Occupational History  . Not on file  Tobacco Use  . Smoking status: Former Smoker    Quit date: 03/04/1999    Years since quitting: 20.2  . Smokeless tobacco: Never Used  Substance and Sexual Activity  . Alcohol use: Yes    Alcohol/week: 6.0 standard drinks    Types: 6 Cans of beer per week  . Drug use: Not Currently  . Sexual activity: Not on file  Other Topics Concern  . Not on file  Social  History Narrative   Married and lives with his wife.  Works for [employer].  Physically active, walks 2 miles daily.   Social Determinants of Health   Financial Resource Strain:   . Difficulty of Paying Living Expenses: Not on file  Food Insecurity:   . Worried About Charity fundraiser in the Last Year: Not on file  . Ran Out of Food in the Last Year: Not on file  Transportation Needs:   . Lack of Transportation (Medical): Not on file  . Lack of Transportation (Non-Medical): Not on file  Physical Activity:   . Days of Exercise per Week: Not on file  . Minutes of Exercise per Session: Not on file  Stress:   . Feeling of Stress : Not on file  Social Connections:   . Frequency of Communication with  Friends and Family: Not on file  . Frequency of Social Gatherings with Friends and Family: Not on file  . Attends Religious Services: Not on file  . Active Member of Clubs or Organizations: Not on file  . Attends Archivist Meetings: Not on file  . Marital Status: Not on file     Family History: The patient's family history includes CVA in his father; Heart attack in his mother; Heart attack (age of onset: 47) in his father; Heart disease in his father and mother; Hypertension in his father; Stomach cancer in his mother. There is no history of Colon cancer, Colon polyps, Esophageal cancer, or Rectal cancer.  ROS:   Please see the history of present illness.    ROS  All other systems reviewed and negative.   EKGs/Labs/Other Studies Reviewed:    The following studies were reviewed today: Outside labs from PCP [facility]  EKG:  EKG is  ordered today.  The ekg ordered today demonstrates sinus bradycardia at 59bpm with no ST changes  Recent Labs: 05/20/2019: ALT 18; BUN 24; Creatinine, Ser 1.03; Potassium 4.8; Sodium 139   Recent Lipid Panel    Component Value Date/Time   CHOL 144 05/20/2019 0708   TRIG 75 05/20/2019 0708   HDL 54 05/20/2019 0708   CHOLHDL 2.7 05/20/2019 0708   CHOLHDL 2.9 03/21/2016 0730   VLDL 15 03/21/2016 0730   LDLCALC 75 05/20/2019 0708   LDLDIRECT 87.0 05/18/2014 0912    Physical Exam:    VS:  BP 124/78   Pulse (!) 59   Ht 5\' 8"  (1.727 m)   Wt 153 lb 9.6 oz (69.7 kg)   BMI 23.35 kg/m     Wt Readings from Last 3 Encounters:  06/03/19 153 lb 9.6 oz (69.7 kg)  05/26/18 154 lb (69.9 kg)  03/09/18 153 lb (69.4 kg)     GEN:  Well nourished, well developed in no acute distress HEENT: Normal NECK: No JVD; No carotid bruits LYMPHATICS: No lymphadenopathy CARDIAC: RRR, no  rubs, gallops.  2/6 SM at RUSB and LLSB to apex RESPIRATORY:  Clear to auscultation without rales, wheezing or rhonchi  ABDOMEN: Soft, non-tender,  non-distended MUSCULOSKELETAL:  No edema; No deformity  SKIN: Warm and dry NEUROLOGIC:  Alert and oriented x 3 PSYCHIATRIC:  Normal affect   ASSESSMENT:    1. Coronary artery disease involving native coronary artery of native heart without angina pectoris   2. Essential hypertension   3. Asymptomatic bilateral carotid artery stenosis   4. Hyperlipidemia with target LDL less than 70   5. Dilated aortic root ([place])    PLAN:  In order of problems listed above:  1.  ASCAD - s/p CABG 2015 with a L-LAD and RIMA-OM1.   -denies any anginal sx -continue ASA 81mg  daily, BB and repatha -he is statin intolerant  2.  HTN  -BP controlled on exam today -continue Lisinopril 20mg  daily and Lopressor 12.5mg  BID -Creatinine 1.03 and K+ 4.8 in Jan 2021  3.  Carotid artery stenosis -dopplers 1-39% bilateral by dopplers 04/2019 -continue Repatha and ASA -Repeat dopplers in 2 years  4.  Hyperlipidemia  - LDL goal is < 70.  -continue Repatha -he is statin intolerant  -LDL was 75 in Jan but he has not been exercising like he usually dose -encouraged increased exercise  5.  Dilated aortic root -his aortic root dimension was 27mm by 2D echo 04/2019 and 82mm by Chest CT 05/2019 -continue statin  -BP controlled -repeat Chest CTA in 1 year  6.  Aortic stenosis/AI -mild AS and mild to moderate AI by echo 04/2019 -repeat 2D echo in 1 year for stability.     Medication Adjustments/Labs and Tests Ordered: Current medicines are reviewed at length with the patient today.  Concerns regarding medicines are outlined above.  Orders Placed This Encounter  Procedures  . EKG 12-Lead   No orders of the defined types were placed in this encounter.   Signed, Fransico Him, MD  06/03/2019 8:07 AM    [place]

## 2019-06-03 ENCOUNTER — Other Ambulatory Visit: Payer: Self-pay

## 2019-06-03 ENCOUNTER — Ambulatory Visit: Payer: BC Managed Care – PPO | Admitting: Cardiology

## 2019-06-03 ENCOUNTER — Other Ambulatory Visit: Payer: BC Managed Care – PPO

## 2019-06-03 ENCOUNTER — Encounter: Payer: Self-pay | Admitting: Cardiology

## 2019-06-03 VITALS — BP 124/78 | HR 59 | Ht 68.0 in | Wt 153.6 lb

## 2019-06-03 DIAGNOSIS — I2510 Atherosclerotic heart disease of native coronary artery without angina pectoris: Secondary | ICD-10-CM

## 2019-06-03 DIAGNOSIS — I6523 Occlusion and stenosis of bilateral carotid arteries: Secondary | ICD-10-CM | POA: Diagnosis not present

## 2019-06-03 DIAGNOSIS — I10 Essential (primary) hypertension: Secondary | ICD-10-CM

## 2019-06-03 DIAGNOSIS — I77810 Thoracic aortic ectasia: Secondary | ICD-10-CM

## 2019-06-03 DIAGNOSIS — E785 Hyperlipidemia, unspecified: Secondary | ICD-10-CM | POA: Diagnosis not present

## 2019-06-03 NOTE — Patient Instructions (Signed)

## 2019-07-30 MED FILL — METOPROLOL SUCCINATE ER 50: 50 | 90 days supply | Qty: 90 | Fill #2

## 2019-07-30 MED FILL — LISINOPRIL 20 MG TABLET: 20 | 90 days supply | Qty: 90 | Fill #2

## 2019-07-30 MED FILL — buPROPion HCL ER (SR) 100 M: 100 | 90 days supply | Qty: 90 | Fill #2

## 2019-10-12 ENCOUNTER — Other Ambulatory Visit (HOSPITAL_COMMUNITY): Payer: Self-pay | Admitting: Internal Medicine

## 2019-10-12 DIAGNOSIS — Z951 Presence of aortocoronary bypass graft: Secondary | ICD-10-CM | POA: Diagnosis not present

## 2019-10-12 DIAGNOSIS — I25810 Atherosclerosis of coronary artery bypass graft(s) without angina pectoris: Secondary | ICD-10-CM | POA: Diagnosis not present

## 2019-10-12 DIAGNOSIS — N402 Nodular prostate without lower urinary tract symptoms: Secondary | ICD-10-CM | POA: Diagnosis not present

## 2019-10-12 DIAGNOSIS — R7301 Impaired fasting glucose: Secondary | ICD-10-CM | POA: Diagnosis not present

## 2019-10-12 DIAGNOSIS — Z0000 Encounter for general adult medical examination without abnormal findings: Secondary | ICD-10-CM | POA: Diagnosis not present

## 2019-10-12 DIAGNOSIS — E7800 Pure hypercholesterolemia, unspecified: Secondary | ICD-10-CM | POA: Diagnosis not present

## 2019-10-12 MED FILL — buPROPion HCL ER (SR) 100 M: 100 | 90 days supply | Qty: 90 | Fill #0

## 2019-10-25 MED FILL — METOPROLOL SUCCINATE ER 50: 50 | 90 days supply | Qty: 90 | Fill #0

## 2019-11-18 MED FILL — buPROPion HCL ER (SR) 100 M: 100 | 90 days supply | Qty: 90 | Fill #0

## 2019-11-18 MED FILL — LISINOPRIL 20 MG TABLET: 20 | 90 days supply | Qty: 90 | Fill #0

## 2019-11-18 MED FILL — METOPROLOL SUCCINATE ER 50: 50 | 90 days supply | Qty: 90 | Fill #0

## 2020-01-26 ENCOUNTER — Other Ambulatory Visit: Payer: Self-pay | Admitting: Cardiology

## 2020-01-27 ENCOUNTER — Other Ambulatory Visit: Payer: Self-pay | Admitting: Pharmacist

## 2020-01-27 NOTE — Telephone Encounter (Signed)
Refilled in different encounter.

## 2020-02-24 MED FILL — METOPROLOL SUCCINATE ER 50: 50 | 90 days supply | Qty: 90 | Fill #1

## 2020-02-24 MED FILL — BUPROPION HCL SR 100 MG TAB: 100 | 90 days supply | Qty: 90 | Fill #1

## 2020-02-24 MED FILL — LISINOPRIL 20 MG TABS: 20 | 90 days supply | Qty: 90 | Fill #1

## 2020-05-15 ENCOUNTER — Other Ambulatory Visit: Payer: Self-pay

## 2020-05-15 DIAGNOSIS — I10 Essential (primary) hypertension: Secondary | ICD-10-CM

## 2020-05-15 DIAGNOSIS — E785 Hyperlipidemia, unspecified: Secondary | ICD-10-CM

## 2020-05-15 DIAGNOSIS — I2510 Atherosclerotic heart disease of native coronary artery without angina pectoris: Secondary | ICD-10-CM

## 2020-05-23 ENCOUNTER — Telehealth: Payer: Self-pay

## 2020-05-23 DIAGNOSIS — I712 Thoracic aortic aneurysm, without rupture, unspecified: Secondary | ICD-10-CM

## 2020-05-23 NOTE — Telephone Encounter (Signed)
Spoke with the patient's wife who is wondering if the patient needs carotid dopplers and Chest CTA. She was unsure if those were yearly along with echocardiogram or not. I advised her that the carotids were every 2 years and Chest CTA is yearly. Chest CTA has been ordered, patient's wife will call back to schedule.

## 2020-05-24 ENCOUNTER — Ambulatory Visit (INDEPENDENT_AMBULATORY_CARE_PROVIDER_SITE_OTHER): Payer: BC Managed Care – PPO

## 2020-05-24 ENCOUNTER — Other Ambulatory Visit: Payer: Self-pay

## 2020-05-24 DIAGNOSIS — I77810 Thoracic aortic ectasia: Secondary | ICD-10-CM

## 2020-05-24 DIAGNOSIS — I712 Thoracic aortic aneurysm, without rupture, unspecified: Secondary | ICD-10-CM

## 2020-05-24 LAB — ECHOCARDIOGRAM COMPLETE
AR max vel: 1.68 cm2
AV Area VTI: 1.57 cm2
AV Area mean vel: 1.62 cm2
AV Mean grad: 13 mmHg
AV Peak grad: 24.3 mmHg
Ao pk vel: 2.47 m/s
Area-P 1/2: 2.81 cm2
Calc EF: 56.6 %
S' Lateral: 3.1 cm
Single Plane A2C EF: 61.1 %
Single Plane A4C EF: 50.9 %

## 2020-06-08 ENCOUNTER — Other Ambulatory Visit: Payer: BC Managed Care – PPO | Admitting: *Deleted

## 2020-06-08 ENCOUNTER — Ambulatory Visit: Payer: BC Managed Care – PPO | Admitting: Cardiology

## 2020-06-08 ENCOUNTER — Encounter: Payer: Self-pay | Admitting: Cardiology

## 2020-06-08 ENCOUNTER — Other Ambulatory Visit: Payer: Self-pay

## 2020-06-08 VITALS — BP 140/90 | HR 50 | Ht 68.0 in | Wt 151.2 lb

## 2020-06-08 DIAGNOSIS — I10 Essential (primary) hypertension: Secondary | ICD-10-CM

## 2020-06-08 DIAGNOSIS — E785 Hyperlipidemia, unspecified: Secondary | ICD-10-CM | POA: Diagnosis not present

## 2020-06-08 DIAGNOSIS — I6523 Occlusion and stenosis of bilateral carotid arteries: Secondary | ICD-10-CM | POA: Diagnosis not present

## 2020-06-08 DIAGNOSIS — I2510 Atherosclerotic heart disease of native coronary artery without angina pectoris: Secondary | ICD-10-CM | POA: Diagnosis not present

## 2020-06-08 DIAGNOSIS — I77810 Thoracic aortic ectasia: Secondary | ICD-10-CM

## 2020-06-08 DIAGNOSIS — I359 Nonrheumatic aortic valve disorder, unspecified: Secondary | ICD-10-CM

## 2020-06-08 LAB — BASIC METABOLIC PANEL
BUN/Creatinine Ratio: 31 — ABNORMAL HIGH (ref 10–24)
BUN: 27 mg/dL (ref 8–27)
CO2: 24 mmol/L (ref 20–29)
Calcium: 9.5 mg/dL (ref 8.6–10.2)
Chloride: 103 mmol/L (ref 96–106)
Creatinine, Ser: 0.87 mg/dL (ref 0.76–1.27)
GFR calc Af Amer: 100 mL/min/{1.73_m2} (ref >59)
GFR calc non Af Amer: 87 mL/min/{1.73_m2} (ref >59)
Glucose: 92 mg/dL (ref 65–99)
Potassium: 4.5 mmol/L (ref 3.5–5.2)
Sodium: 141 mmol/L (ref 134–144)

## 2020-06-08 LAB — LIPID PANEL
Chol/HDL Ratio: 2.8 ratio (ref 0.0–5.0)
Cholesterol, Total: 136 mg/dL (ref 100–199)
HDL: 49 mg/dL (ref >39)
LDL Chol Calc (NIH): 73 mg/dL (ref 0–99)
Triglycerides: 68 mg/dL (ref 0–149)
VLDL Cholesterol Cal: 14 mg/dL (ref 5–40)

## 2020-06-08 LAB — ALT: ALT: 21 IU/L (ref 0–44)

## 2020-06-08 NOTE — Patient Instructions (Signed)
Please check your blood pressure daily for one week and call us or send a MyChart message with a list of your readings.   Medication Instructions:  Your physician recommends that you continue on your current medications as directed. Please refer to the Current Medication list given to you today.  *If you need a refill on your cardiac medications before your next appointment, please call your pharmacy*  Follow-Up: At [facility], you and your health needs are our priority.  As part of our continuing mission to provide you with exceptional heart care, we have created designated Provider Care Teams.  These Care Teams include your primary Cardiologist (physician) and Advanced Practice Providers (APPs -  Physician Assistants and Nurse Practitioners) who all work together to provide you with the care you need, when you need it.   Your next appointment:   1 year(s)  The format for your next appointment:   In Person  Provider:   You may see Fransico Him, MD or one of the following Advanced Practice Providers on your designated Care Team:    Melina Copa, PA-C  Ermalinda Barrios, PA-C

## 2020-06-08 NOTE — Progress Notes (Signed)
Cardiology Office Note:    Date:  06/08/2020   ID:  Travis Harrison, DOB 11-08-1950, MRN [id number]  PCP:  Wenda Low, MD  Cardiologist:  Fransico Him, MD    Referring MD: Wenda Low, MD   Chief Complaint  Patient presents with  . Labs Only  . Coronary Artery Disease  . Hyperlipidemia  . Hypertension    History of Present Illness:    Travis Harrison is a 69 y.o. male with a hx of HTN, dyslipidemia, moderate carotid artery stenosis and ASCAD s/p CABG 2015 with a L-LAD and RIMA-OM1. Pre-op carotid US demonstrated RICA 81-19% and LICA 1-47%.   He is here today for followup and is doing well.  He denies any chest pain or pressure, SOB, DOE, PND, orthopnea, LE edema, dizziness, palpitations or syncope. He is compliant with his meds and is tolerating meds with no SE.    Past Medical History:  Diagnosis Date  . Aortic valve disease    mild to moderate AR and mild AS by echo 04/2019  . Ascending aortic aneurysm ([place]) 05/26/2018   104mm by echo 04/2019 and 36mm by Chest CTA 04/2019  . Carotid artery stenosis, asymptomatic    1-39% bilateral stenosis by dopplers 04/2019  . Hyperlipidemia   . Hypertension   . 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 CARDIAC CATHETERIZATION    . COLONOSCOPY  2009   AT [place]=normal exam per pt  . CORONARY ARTERY BYPASS GRAFT N/A 03/04/2014   Procedure: CORONARY ARTERY BYPASS GRAFTING (CABG);  Surgeon: Rexene Alberts, MD;  Location: [place];  Service: Open Heart Surgery;  Laterality: N/A;  . INTRAOPERATIVE TRANSESOPHAGEAL ECHOCARDIOGRAM N/A 03/04/2014   Procedure: INTRAOPERATIVE TRANSESOPHAGEAL ECHOCARDIOGRAM;  Surgeon: Rexene Alberts, MD;  Location: [place];  Service: Open Heart Surgery;  Laterality: N/A;  . KNEE ARTHROSCOPY Bilateral 2018   bil.  Marland Kitchen LEFT HEART CATHETERIZATION WITH CORONARY ANGIOGRAM N/A 03/03/2014   Procedure: LEFT HEART CATHETERIZATION WITH CORONARY  ANGIOGRAM;  Surgeon: Blane Ohara, MD;  Location: [facility] CATH LAB;  Service: Cardiovascular;  Laterality: N/A;    Current Medications: Current Meds  Medication Sig  . aspirin 81 MG EC tablet Take 1 tablet (81 mg total) by mouth daily.  Marland Kitchen buPROPion (WELLBUTRIN SR) 100 MG 12 hr tablet Take 100 mg by mouth daily.  Marland Kitchen lisinopril (ZESTRIL) 20 MG tablet Take 1 tablet (20 mg total) by mouth daily.  . metoprolol tartrate (LOPRESSOR) 25 MG tablet Take 0.5 tablets (12.5 mg total) by mouth 2 (two) times daily.  Marland Kitchen REPATHA SURECLICK 829 MG/ML SOAJ INJECT 140 MG UNDER THE SKIN (SUBCUTANEOUS INJECTION) EVERY 14 DAYS     Allergies:   Patient has no known allergies.   Social History   Socioeconomic History  . Marital status: Divorced    Spouse name: Not on file  . Number of children: Not on file  . Years of education: Not on file  . Highest education level: Not on file  Occupational History  . Not on file  Tobacco Use  . Smoking status: Former Smoker    Quit date: 03/04/1999    Years since quitting: 21.2  . Smokeless tobacco: Never Used  Vaping Use  . Vaping Use: Never used  Substance and Sexual Activity  . Alcohol use: Yes    Alcohol/week: 6.0 standard drinks    Types: 6 Cans of beer  per week  . Drug use: Not Currently  . Sexual activity: Not on file  Other Topics Concern  . Not on file  Social History Narrative   Married and lives with his wife.  Works for [employer].  Physically active, walks 2 miles daily.   Social Determinants of Health   Financial Resource Strain: Not on file  Food Insecurity: Not on file  Transportation Needs: Not on file  Physical Activity: Not on file  Stress: Not on file  Social Connections: Not on file     Family History: The patient's family history includes CVA in his father; Heart attack in his mother; Heart attack (age of onset: 52) in his father; Heart disease in his father and mother; Hypertension in his father; Stomach cancer in his  mother. There is no history of Colon cancer, Colon polyps, Esophageal cancer, or Rectal cancer.  ROS:   Please see the history of present illness.    ROS  All other systems reviewed and negative.   EKGs/Labs/Other Studies Reviewed:    The following studies were reviewed today: Outside labs from PCP from [facility]  EKG:  EKG is  ordered today.  The ekg ordered today demonstrates sinus bradycardia at 50bpm with no ST changes  Recent Labs: No results found for requested labs within last 8760 hours.   Recent Lipid Panel    Component Value Date/Time   CHOL 144 05/20/2019 0708   TRIG 75 05/20/2019 0708   HDL 54 05/20/2019 0708   CHOLHDL 2.7 05/20/2019 0708   CHOLHDL 2.9 03/21/2016 0730   VLDL 15 03/21/2016 0730   LDLCALC 75 05/20/2019 0708   LDLDIRECT 87.0 05/18/2014 0912    Physical Exam:    VS:  BP 140/90   Pulse (!) 50   Ht 5\' 8"  (1.727 m)   Wt 151 lb 3.2 oz (68.6 kg)   SpO2 99%   BMI 22.99 kg/m     Wt Readings from Last 3 Encounters:  06/08/20 151 lb 3.2 oz (68.6 kg)  06/03/19 153 lb 9.6 oz (69.7 kg)  05/26/18 154 lb (69.9 kg)     GEN: Well nourished, well developed in no acute distress HEENT: Normal NECK: No JVD; No carotid bruits LYMPHATICS: No lymphadenopathy CARDIAC:RRR, no murmurs, rubs, gallops RESPIRATORY:  Clear to auscultation without rales, wheezing or rhonchi  ABDOMEN: Soft, non-tender, non-distended MUSCULOSKELETAL:  No edema; No deformity  SKIN: Warm and dry NEUROLOGIC:  Alert and oriented x 3 PSYCHIATRIC:  Normal affect    ASSESSMENT:    1. Coronary artery disease involving native coronary artery of native heart without angina pectoris   2. Essential hypertension   3. Asymptomatic bilateral carotid artery stenosis   4. Hyperlipidemia with target LDL less than 70   5. Dilated aortic root ([place])   6. Aortic valve disease    PLAN:    In order of problems listed above:  1.  ASCAD - s/p CABG 2015 with a L-LAD and RIMA-OM1.   -denies any  anginal sx -continue ASA 81mg  daily, BB and repatha -he is statin intolerant  2.  HTN  -BP borderline controlled on exam today -continue Lisinopril 20mg  daily and Lopressor 12.5mg  BID  3.  Carotid artery stenosis -dopplers 1-39% bilateral by dopplers 04/2019 -continue Repatha and ASA -Repeat dopplers in 2 years  4.  Hyperlipidemia  - LDL goal is < 70.  -continue Repatha -he is statin intolerant  -LDL was 54 in June -I will get the CMET and  FLP in June 2022 when he has his PE with PCP  5.  Dilated aortic root -his aortic root dimension was 34mm by 2D echo 04/2019 and 20mm by Chest CT 05/2019 -continue statin  -BP controlled -repeat Chest CTA in 1 year  6.  Aortic stenosis/AI -mild AS and mild to moderate AI by echo 04/2019 -repeat 2D echo   Medication Adjustments/Labs and Tests Ordered: Current medicines are reviewed at length with the patient today.  Concerns regarding medicines are outlined above.  No orders of the defined types were placed in this encounter.  No orders of the defined types were placed in this encounter.   Signed, Fransico Him, MD  06/08/2020 8:11 AM    [place]

## 2020-06-12 NOTE — Addendum Note (Signed)
Addended by: Lanna Poche R on: 06/12/2020 01:26 PM   Modules accepted: Orders

## 2020-06-22 ENCOUNTER — Ambulatory Visit (INDEPENDENT_AMBULATORY_CARE_PROVIDER_SITE_OTHER)
Admission: RE | Admit: 2020-06-22 | Discharge: 2020-06-22 | Disposition: A | Discharge status: Home / Self Care | Payer: BC Managed Care – PPO | Source: Ambulatory Visit | Attending: Cardiology | Admitting: Cardiology

## 2020-06-22 ENCOUNTER — Other Ambulatory Visit: Payer: Self-pay

## 2020-06-22 DIAGNOSIS — I712 Thoracic aortic aneurysm, without rupture, unspecified: Secondary | ICD-10-CM

## 2020-06-22 MED ORDER — IOHEXOL 350 MG/ML SOLN
100.0000 mL | Freq: Once | INTRAVENOUS | Status: AC | PRN
  Administered 2020-06-22: 100 mL via INTRAVENOUS

## 2020-06-23 ENCOUNTER — Encounter: Payer: Self-pay | Admitting: Cardiology

## 2020-07-23 ENCOUNTER — Other Ambulatory Visit (HOSPITAL_COMMUNITY): Payer: Self-pay

## 2020-07-23 MED FILL — Bupropion HCl Tab ER 12HR 100 MG: ORAL | 90 days supply | Qty: 90 | Fill #0 | Status: AC

## 2020-07-23 MED FILL — Metoprolol Succinate Tab ER 24HR 50 MG (Tartrate Equiv): ORAL | 90 days supply | Qty: 90 | Fill #0 | Status: AC

## 2020-07-23 MED FILL — Lisinopril Tab 20 MG: ORAL | 90 days supply | Qty: 90 | Fill #0 | Status: AC

## 2020-10-17 ENCOUNTER — Other Ambulatory Visit (HOSPITAL_COMMUNITY): Payer: Self-pay

## 2020-10-17 DIAGNOSIS — I25810 Atherosclerosis of coronary artery bypass graft(s) without angina pectoris: Secondary | ICD-10-CM | POA: Diagnosis not present

## 2020-10-17 DIAGNOSIS — Z125 Encounter for screening for malignant neoplasm of prostate: Secondary | ICD-10-CM | POA: Diagnosis not present

## 2020-10-17 DIAGNOSIS — I10 Essential (primary) hypertension: Secondary | ICD-10-CM | POA: Diagnosis not present

## 2020-10-17 DIAGNOSIS — E7800 Pure hypercholesterolemia, unspecified: Secondary | ICD-10-CM | POA: Diagnosis not present

## 2020-10-17 DIAGNOSIS — Z0000 Encounter for general adult medical examination without abnormal findings: Secondary | ICD-10-CM | POA: Diagnosis not present

## 2020-10-17 DIAGNOSIS — Z23 Encounter for immunization: Secondary | ICD-10-CM | POA: Diagnosis not present

## 2020-10-17 DIAGNOSIS — Z8249 Family history of ischemic heart disease and other diseases of the circulatory system: Secondary | ICD-10-CM | POA: Diagnosis not present

## 2020-10-17 MED ORDER — BUPROPION HCL ER (SR) 100 MG PO TB12
100.0000 mg | ORAL_TABLET | Freq: Every day | ORAL | 4 refills | Status: DC
  Filled 2020-10-17: qty 90, 90d supply, fill #0
  Filled 2021-04-20 – 2021-07-03 (×3): qty 90, 90d supply, fill #1
  Filled 2021-09-19: qty 90, 90d supply, fill #2

## 2020-10-17 MED ORDER — METOPROLOL SUCCINATE ER 50 MG PO TB24
ORAL_TABLET | ORAL | 4 refills | Status: DC
  Filled 2020-10-17: qty 90, 90d supply, fill #0
  Filled 2021-04-20 – 2021-07-03 (×3): qty 90, 90d supply, fill #1
  Filled 2021-09-19: qty 90, 90d supply, fill #2

## 2020-10-17 MED ORDER — LISINOPRIL 20 MG PO TABS
ORAL_TABLET | ORAL | 4 refills | Status: DC
  Filled 2020-10-17: qty 90, 90d supply, fill #0
  Filled 2021-04-20 – 2021-05-08 (×2): qty 90, 90d supply, fill #1

## 2020-12-11 ENCOUNTER — Other Ambulatory Visit: Payer: Self-pay | Admitting: Pharmacist

## 2020-12-11 MED ORDER — REPATHA SURECLICK 140 MG/ML ~~LOC~~ SOAJ: 1.0000 "pen " | SUBCUTANEOUS | 11 refills | Status: DC

## 2020-12-12 ENCOUNTER — Telehealth: Payer: Self-pay | Admitting: Pharmacist

## 2020-12-12 NOTE — Telephone Encounter (Signed)
Pt's wife reports that pt retired recently and does not have prescription drug insurance any more. He tried to have his Repatha refilled and copay was > $500. Asked if pt is planning on signing up for a Part D Medicare plan, she states yes. Also asks if he could be added to her [facility] prescription drug insurance. I believe he can be but encouraged her to call to confirm. He will try to sign up for insurance plan within the next week. Provided her with 1 Repatha sample to bring home to pt as he was due for his last Repatha dose a few days ago. She will let me know which insurance plan he signs up for since new prior authorization will be needed.

## 2021-02-14 ENCOUNTER — Other Ambulatory Visit (HOSPITAL_COMMUNITY): Payer: Self-pay

## 2021-04-20 ENCOUNTER — Other Ambulatory Visit (HOSPITAL_COMMUNITY): Payer: Self-pay

## 2021-04-24 ENCOUNTER — Other Ambulatory Visit: Payer: Self-pay | Admitting: Pharmacist

## 2021-04-24 ENCOUNTER — Other Ambulatory Visit (HOSPITAL_COMMUNITY): Payer: Self-pay

## 2021-04-24 ENCOUNTER — Telehealth: Payer: Self-pay | Admitting: Pharmacist

## 2021-04-24 MED ORDER — REPATHA SURECLICK 140 MG/ML ~~LOC~~ SOAJ
1.0000 "pen " | SUBCUTANEOUS | 3 refills | Status: DC
  Filled 2021-04-24: qty 6, 84d supply, fill #0
  Filled 2021-07-30: qty 6, 84d supply, fill #1
  Filled 2021-12-14: qty 6, 84d supply, fill #2
  Filled 2022-04-10: qty 6, 84d supply, fill #3

## 2021-04-24 NOTE — Telephone Encounter (Signed)
Refill sent to [facility] per pt request. He is aware we have not heard back yet with prior authorization approval so it may be a few days before the pharmacy can fill rx.

## 2021-04-24 NOTE — Telephone Encounter (Signed)
PA for repatha sent to [facility]. Wife already acquired copay card. When approved, would like Rx and [facility].

## 2021-04-25 ENCOUNTER — Other Ambulatory Visit (HOSPITAL_COMMUNITY): Payer: Self-pay

## 2021-04-27 ENCOUNTER — Other Ambulatory Visit (HOSPITAL_COMMUNITY): Payer: Self-pay

## 2021-05-01 ENCOUNTER — Other Ambulatory Visit (HOSPITAL_COMMUNITY): Payer: Self-pay

## 2021-05-08 ENCOUNTER — Other Ambulatory Visit (HOSPITAL_COMMUNITY): Payer: Self-pay

## 2021-05-13 ENCOUNTER — Encounter: Payer: Self-pay | Admitting: Cardiology

## 2021-05-13 DIAGNOSIS — I2510 Atherosclerotic heart disease of native coronary artery without angina pectoris: Secondary | ICD-10-CM

## 2021-05-13 DIAGNOSIS — I10 Essential (primary) hypertension: Secondary | ICD-10-CM

## 2021-05-13 DIAGNOSIS — E785 Hyperlipidemia, unspecified: Secondary | ICD-10-CM

## 2021-05-21 ENCOUNTER — Telehealth: Payer: Self-pay

## 2021-05-21 NOTE — Telephone Encounter (Signed)
Patient would like to know if he needs a carotid US, Echo or CT scan this year. He would like to have any of his yearly testing done prior to his annual appointment with Dr. Radford Pax.

## 2021-05-24 ENCOUNTER — Ambulatory Visit (INDEPENDENT_AMBULATORY_CARE_PROVIDER_SITE_OTHER): Payer: 59 | Admitting: Cardiology

## 2021-05-24 ENCOUNTER — Other Ambulatory Visit: Payer: Self-pay

## 2021-05-24 ENCOUNTER — Other Ambulatory Visit (HOSPITAL_COMMUNITY): Payer: Self-pay

## 2021-05-24 ENCOUNTER — Other Ambulatory Visit: Payer: Medicare Other

## 2021-05-24 ENCOUNTER — Encounter: Payer: Self-pay | Admitting: Cardiology

## 2021-05-24 VITALS — BP 144/80 | HR 52 | Ht 68.0 in | Wt 153.6 lb

## 2021-05-24 DIAGNOSIS — I2510 Atherosclerotic heart disease of native coronary artery without angina pectoris: Secondary | ICD-10-CM

## 2021-05-24 DIAGNOSIS — I359 Nonrheumatic aortic valve disorder, unspecified: Secondary | ICD-10-CM

## 2021-05-24 DIAGNOSIS — I10 Essential (primary) hypertension: Secondary | ICD-10-CM | POA: Diagnosis not present

## 2021-05-24 DIAGNOSIS — E785 Hyperlipidemia, unspecified: Secondary | ICD-10-CM

## 2021-05-24 DIAGNOSIS — I77810 Thoracic aortic ectasia: Secondary | ICD-10-CM | POA: Diagnosis not present

## 2021-05-24 DIAGNOSIS — I6523 Occlusion and stenosis of bilateral carotid arteries: Secondary | ICD-10-CM

## 2021-05-24 LAB — LIPID PANEL
Chol/HDL Ratio: 2.4 ratio (ref 0.0–5.0)
Cholesterol, Total: 144 mg/dL (ref 100–199)
HDL: 59 mg/dL (ref >39)
LDL Chol Calc (NIH): 66 mg/dL (ref 0–99)
Triglycerides: 103 mg/dL (ref 0–149)
VLDL Cholesterol Cal: 19 mg/dL (ref 5–40)

## 2021-05-24 LAB — COMPREHENSIVE METABOLIC PANEL
ALT: 27 IU/L (ref 0–44)
AST: 23 IU/L (ref 0–40)
Albumin/Globulin Ratio: 2.9 — ABNORMAL HIGH (ref 1.2–2.2)
Albumin: 4.6 g/dL (ref 3.7–4.7)
Alkaline Phosphatase: 77 IU/L (ref 44–121)
BUN/Creatinine Ratio: 31 — ABNORMAL HIGH (ref 10–24)
BUN: 24 mg/dL (ref 8–27)
Bilirubin Total: 0.4 mg/dL (ref 0.0–1.2)
CO2: 27 mmol/L (ref 20–29)
Calcium: 9.9 mg/dL (ref 8.6–10.2)
Chloride: 100 mmol/L (ref 96–106)
Creatinine, Ser: 0.77 mg/dL (ref 0.76–1.27)
Globulin, Total: 1.6 g/dL (ref 1.5–4.5)
Glucose: 72 mg/dL (ref 70–99)
Potassium: 4.8 mmol/L (ref 3.5–5.2)
Sodium: 139 mmol/L (ref 134–144)
Total Protein: 6.2 g/dL (ref 6.0–8.5)
eGFR: 95 mL/min/{1.73_m2} (ref >59)

## 2021-05-24 MED ORDER — LISINOPRIL 40 MG PO TABS
40.0000 mg | ORAL_TABLET | Freq: Every day | ORAL | 3 refills | Status: DC
  Filled 2021-05-24: qty 90, 90d supply, fill #0
  Filled 2021-09-19: qty 90, 90d supply, fill #1
  Filled 2021-12-14: qty 90, 90d supply, fill #2
  Filled 2022-04-10: qty 90, 90d supply, fill #3

## 2021-05-24 NOTE — Addendum Note (Signed)
Addended by: Antonieta Iba on: 05/24/2021 09:47 AM   Modules accepted: Orders

## 2021-05-24 NOTE — Addendum Note (Signed)
Addended by: Antonieta Iba on: 05/24/2021 09:51 AM   Modules accepted: Orders

## 2021-05-24 NOTE — Patient Instructions (Signed)
Medication Instructions:  Your physician has recommended you make the following change in your medication: 1) INCREASE lisinopril to 40 mg daily   TAKE YOUR BLOOD PRESSURE DAILY FOR ONE WEEK AND SEND A MESSAGE WITH A LIST OF YOUR READINGS.   *If you need a refill on your cardiac medications before your next appointment, please call your pharmacy*   Lab Work: BMET in one week If you have labs (blood work) drawn today and your tests are completely normal, you will receive your results only by: [place] (if you have MyChart) OR A paper copy in the mail If you have any lab test that is abnormal or we need to change your treatment, we will call you to review the results.   Testing/Procedures: Your physician has requested that you have an echocardiogram. Echocardiography is a painless test that uses sound waves to create images of your heart. It provides your doctor with information about the size and shape of your heart and how well your hearts chambers and valves are working. This procedure takes approximately one hour. There are no restrictions for this procedure.  Your physician has requested that you have a carotid duplex. This test is an ultrasound of the carotid arteries in your neck. It looks at blood flow through these arteries that supply the brain with blood. Allow one hour for this exam. There are no restrictions or special instructions.  Your physician has requested that you have a chest CTA scan.   Follow-Up: At [facility], you and your health needs are our priority.  As part of our continuing mission to provide you with exceptional heart care, we have created designated Provider Care Teams.  These Care Teams include your primary Cardiologist (physician) and Advanced Practice Providers (APPs -  Physician Assistants and Nurse Practitioners) who all work together to provide you with the care you need, when you need it.  Your next appointment:   1 year(s)  The format  for your next appointment:   In Person  Provider:   Fransico Him, MD

## 2021-05-24 NOTE — Progress Notes (Signed)
Cardiology Office Note:    Date:  05/24/2021   ID:  Travis Harrison, DOB 1951-03-24, MRN [id number]  PCP:  Wenda Low, MD  Cardiologist:  Fransico Him, MD    Referring MD: Wenda Low, MD   Chief Complaint  Patient presents with   Coronary Artery Disease   Hyperlipidemia   Hypertension   Aortic Stenosis   Aortic Insuffiency    History of Present Illness:    Travis Harrison is a 71 y.o. male with a hx of HTN, dyslipidemia, moderate carotid artery stenosis and ASCAD s/p CABG 2015 with a L-LAD and RIMA-OM1.  Pre-op carotid US demonstrated RICA 48-54% and LICA 6-27%.   He is here today for followup and is doing well.  He denies any chest pain or pressure, SOB, DOE, PND, orthopnea, LE edema, dizziness, palpitations or syncope. He is compliant with his meds and is tolerating meds with no SE.     Past Medical History:  Diagnosis Date   Aortic valve disease    mild to moderate AR and mild AS by echo 04/2019   Ascending aortic aneurysm 05/26/2018   61mm by echo 04/2019 and 53mm by Chest CTA 06/2020   Carotid artery stenosis, asymptomatic    1-39% bilateral stenosis by dopplers 04/2019   Hyperlipidemia    Hypertension    S/P CABG x 2 03/04/2014   LIMA to LAD and RIMA to OM1    Past Surgical History:  Procedure Laterality Date   CARDIAC CATHETERIZATION     COLONOSCOPY  2009   AT [place]=normal exam per pt   CORONARY ARTERY BYPASS GRAFT N/A 03/04/2014   Procedure: CORONARY ARTERY BYPASS GRAFTING (CABG);  Surgeon: Rexene Alberts, MD;  Location: [place];  Service: Open Heart Surgery;  Laterality: N/A;   INTRAOPERATIVE TRANSESOPHAGEAL ECHOCARDIOGRAM N/A 03/04/2014   Procedure: INTRAOPERATIVE TRANSESOPHAGEAL ECHOCARDIOGRAM;  Surgeon: Rexene Alberts, MD;  Location: [place];  Service: Open Heart Surgery;  Laterality: N/A;   KNEE ARTHROSCOPY Bilateral 2018   bil.   LEFT HEART CATHETERIZATION WITH CORONARY ANGIOGRAM N/A 03/03/2014   Procedure: LEFT HEART CATHETERIZATION WITH CORONARY  ANGIOGRAM;  Surgeon: Blane Ohara, MD;  Location: [facility] CATH LAB;  Service: Cardiovascular;  Laterality: N/A;    Current Medications: Current Meds  Medication Sig   aspirin 81 MG EC tablet Take 1 tablet (81 mg total) by mouth daily.   buPROPion (WELLBUTRIN SR) 150 MG 12 hr tablet Take 150 mg by mouth daily.   Evolocumab (REPATHA SURECLICK) 035 MG/ML SOAJ Inject 1 pen as directed every 14 days.   lisinopril (ZESTRIL) 20 MG tablet Take 1 tablet (20 mg total) by mouth daily.   lisinopril (ZESTRIL) 20 MG tablet Take 1 tablet by mouth Once a day   metoprolol succinate (TOPROL-XL) 50 MG 24 hr tablet Take 1 tablet by mouth Once a day     Allergies:   Patient has no known allergies.   Social History   Socioeconomic History   Marital status: Divorced    Spouse name: Not on file   Number of children: Not on file   Years of education: Not on file   Highest education level: Not on file  Occupational History   Not on file  Tobacco Use   Smoking status: Former    Types: Cigarettes    Quit date: 03/04/1999    Years since quitting: 22.2   Smokeless tobacco: Never  Vaping Use   Vaping Use: Never used  Substance and Sexual Activity  Alcohol use: Yes    Alcohol/week: 6.0 standard drinks    Types: 6 Cans of beer per week   Drug use: Not Currently   Sexual activity: Not on file  Other Topics Concern   Not on file  Social History Narrative   Married and lives with his wife.  Works for [employer].  Physically active, walks 2 miles daily.   Social Determinants of Health   Financial Resource Strain: Not on file  Food Insecurity: Not on file  Transportation Needs: Not on file  Physical Activity: Not on file  Stress: Not on file  Social Connections: Not on file     Family History: The patient's family history includes CVA in his father; Heart attack in his mother; Heart attack (age of onset: 60) in his father; Heart disease in his father and mother; Hypertension in his  father; Stomach cancer in his mother. There is no history of Colon cancer, Colon polyps, Esophageal cancer, or Rectal cancer.  ROS:   Please see the history of present illness.    ROS  All other systems reviewed and negative.   EKGs/Labs/Other Studies Reviewed:    The following studies were reviewed today: Outside labs from PCP [facility]  EKG:  EKG is  ordered today.  The ekg ordered today demonstrates sinus bradycardia with no ST changes Recent Labs: 06/08/2020: ALT 21; BUN 27; Creatinine, Ser 0.87; Potassium 4.5; Sodium 141   Recent Lipid Panel    Component Value Date/Time   CHOL 136 06/08/2020 0715   TRIG 68 06/08/2020 0715   HDL 49 06/08/2020 0715   CHOLHDL 2.8 06/08/2020 0715   CHOLHDL 2.9 03/21/2016 0730   VLDL 15 03/21/2016 0730   LDLCALC 73 06/08/2020 0715   LDLDIRECT 87.0 05/18/2014 0912    Physical Exam:    VS:  BP (!) 144/80    Pulse (!) 52    Ht 5\' 8"  (1.727 m)    Wt 153 lb 9.6 oz (69.7 kg)    SpO2 98%    BMI 23.35 kg/m     Wt Readings from Last 3 Encounters:  05/24/21 153 lb 9.6 oz (69.7 kg)  06/08/20 151 lb 3.2 oz (68.6 kg)  06/03/19 153 lb 9.6 oz (69.7 kg)     GEN: Well nourished, well developed in no acute distress HEENT: Normal NECK: No JVD; No carotid bruits LYMPHATICS: No lymphadenopathy CARDIAC:RRR, no murmurs, rubs, gallops RESPIRATORY:  Clear to auscultation without rales, wheezing or rhonchi  ABDOMEN: Soft, non-tender, non-distended MUSCULOSKELETAL:  No edema; No deformity  SKIN: Warm and dry NEUROLOGIC:  Alert and oriented x 3 PSYCHIATRIC:  Normal affect    ASSESSMENT:    1. Coronary artery disease involving native coronary artery of native heart without angina pectoris   2. Essential hypertension   3. Asymptomatic bilateral carotid artery stenosis   4. Hyperlipidemia with target LDL less than 70   5. Dilated aortic root ([place])   6. Aortic valve disease    PLAN:    In order of problems listed above:  1.  ASCAD - s/p CABG 2015 with  a L-LAD and RIMA-OM1.   -he has not had any anginal sx since I saw him last -continue ASA 81mg  daily, BB and repatha -he is statin intolerant  2.  HTN  -BP borderline controlled today>>goal is < 130/61mmHg given his dilated aorta -increase Lisinopril to 40mg  daily -check BMET in 1 week -continue prescription drug management with Toprol XL 50mg  daily with PRN  refills   3.  Carotid artery stenosis -dopplers 1-39% bilateral by dopplers 04/2019 -continue Repatha and ASA -repeat dopplers   4.  Hyperlipidemia  - LDL goal is < 70.  -continue prescription drug management with Repatha -he is statin intolerant  -check FLP and ALT   5.  Dilated aortic root -his aortic root dimension was 62mm by 2D echo 04/2019 and 74mm by Chest CT 06/2020 -continue statin  -BP needs better control -repeat Chest CTA   6.  Aortic stenosis/AI -mild AS and mild to moderate AI by echo 04/2019 -repeat echo   Medication Adjustments/Labs and Tests Ordered: Current medicines are reviewed at length with the patient today.  Concerns regarding medicines are outlined above.  Orders Placed This Encounter  Procedures   EKG 12-Lead   No orders of the defined types were placed in this encounter.   Signed, Fransico Him, MD  05/24/2021 9:37 AM    [place]

## 2021-06-01 ENCOUNTER — Other Ambulatory Visit: Payer: Medicare Other | Admitting: *Deleted

## 2021-06-01 ENCOUNTER — Other Ambulatory Visit: Payer: Self-pay

## 2021-06-01 DIAGNOSIS — I10 Essential (primary) hypertension: Secondary | ICD-10-CM | POA: Diagnosis not present

## 2021-06-01 DIAGNOSIS — I2510 Atherosclerotic heart disease of native coronary artery without angina pectoris: Secondary | ICD-10-CM | POA: Diagnosis not present

## 2021-06-01 DIAGNOSIS — I6523 Occlusion and stenosis of bilateral carotid arteries: Secondary | ICD-10-CM

## 2021-06-01 DIAGNOSIS — E785 Hyperlipidemia, unspecified: Secondary | ICD-10-CM | POA: Diagnosis not present

## 2021-06-01 DIAGNOSIS — I359 Nonrheumatic aortic valve disorder, unspecified: Secondary | ICD-10-CM

## 2021-06-01 DIAGNOSIS — I77810 Thoracic aortic ectasia: Secondary | ICD-10-CM | POA: Diagnosis not present

## 2021-06-01 LAB — BASIC METABOLIC PANEL
BUN/Creatinine Ratio: 30 — ABNORMAL HIGH (ref 10–24)
BUN: 21 mg/dL (ref 8–27)
CO2: 25 mmol/L (ref 20–29)
Calcium: 9.5 mg/dL (ref 8.6–10.2)
Chloride: 101 mmol/L (ref 96–106)
Creatinine, Ser: 0.69 mg/dL — ABNORMAL LOW (ref 0.76–1.27)
Glucose: 83 mg/dL (ref 70–99)
Potassium: 4.9 mmol/L (ref 3.5–5.2)
Sodium: 140 mmol/L (ref 134–144)
eGFR: 98 mL/min/{1.73_m2} (ref >59)

## 2021-06-15 ENCOUNTER — Other Ambulatory Visit: Payer: Self-pay | Admitting: Cardiology

## 2021-06-15 DIAGNOSIS — I359 Nonrheumatic aortic valve disorder, unspecified: Secondary | ICD-10-CM

## 2021-06-15 DIAGNOSIS — E785 Hyperlipidemia, unspecified: Secondary | ICD-10-CM

## 2021-06-15 DIAGNOSIS — I2510 Atherosclerotic heart disease of native coronary artery without angina pectoris: Secondary | ICD-10-CM

## 2021-06-15 DIAGNOSIS — I6523 Occlusion and stenosis of bilateral carotid arteries: Secondary | ICD-10-CM

## 2021-06-15 DIAGNOSIS — I77810 Thoracic aortic ectasia: Secondary | ICD-10-CM

## 2021-06-29 ENCOUNTER — Ambulatory Visit (INDEPENDENT_AMBULATORY_CARE_PROVIDER_SITE_OTHER): Payer: 59

## 2021-06-29 ENCOUNTER — Encounter: Payer: Self-pay | Admitting: Cardiology

## 2021-06-29 ENCOUNTER — Other Ambulatory Visit: Payer: Self-pay

## 2021-06-29 ENCOUNTER — Ambulatory Visit
Admission: RE | Admit: 2021-06-29 | Discharge: 2021-06-29 | Disposition: A | Discharge status: Home / Self Care | Payer: 59 | Source: Ambulatory Visit | Attending: Cardiology | Admitting: Cardiology

## 2021-06-29 DIAGNOSIS — E785 Hyperlipidemia, unspecified: Secondary | ICD-10-CM | POA: Insufficient documentation

## 2021-06-29 DIAGNOSIS — I359 Nonrheumatic aortic valve disorder, unspecified: Secondary | ICD-10-CM

## 2021-06-29 DIAGNOSIS — I7121 Aneurysm of the ascending aorta, without rupture: Secondary | ICD-10-CM | POA: Diagnosis not present

## 2021-06-29 DIAGNOSIS — I6523 Occlusion and stenosis of bilateral carotid arteries: Secondary | ICD-10-CM

## 2021-06-29 DIAGNOSIS — I2510 Atherosclerotic heart disease of native coronary artery without angina pectoris: Secondary | ICD-10-CM

## 2021-06-29 DIAGNOSIS — I77810 Thoracic aortic ectasia: Secondary | ICD-10-CM

## 2021-06-29 LAB — ECHOCARDIOGRAM COMPLETE
AR max vel: 1.58 cm2
AV Area VTI: 1.65 cm2
AV Area mean vel: 1.51 cm2
AV Mean grad: 15 mmHg
AV Peak grad: 27.9 mmHg
AV Vena cont: 0.4 cm
Ao pk vel: 2.64 m/s
Area-P 1/2: 3.07 cm2
Calc EF: 50.3 %
P 1/2 time: 569 msec
S' Lateral: 3.5 cm
Single Plane A2C EF: 50.3 %
Single Plane A4C EF: 50.8 %

## 2021-06-29 MED ORDER — IOHEXOL 350 MG/ML SOLN
75.0000 mL | Freq: Once | INTRAVENOUS | Status: AC | PRN
  Administered 2021-06-29: 75 mL via INTRAVENOUS

## 2021-07-02 ENCOUNTER — Encounter: Payer: Self-pay | Admitting: Cardiology

## 2021-07-03 ENCOUNTER — Other Ambulatory Visit (HOSPITAL_COMMUNITY): Payer: Self-pay

## 2021-07-06 ENCOUNTER — Telehealth: Payer: Self-pay

## 2021-07-06 DIAGNOSIS — I2510 Atherosclerotic heart disease of native coronary artery without angina pectoris: Secondary | ICD-10-CM

## 2021-07-06 DIAGNOSIS — I77810 Thoracic aortic ectasia: Secondary | ICD-10-CM

## 2021-07-06 DIAGNOSIS — I351 Nonrheumatic aortic (valve) insufficiency: Secondary | ICD-10-CM

## 2021-07-06 DIAGNOSIS — I6523 Occlusion and stenosis of bilateral carotid arteries: Secondary | ICD-10-CM

## 2021-07-06 NOTE — Telephone Encounter (Signed)
-----   Message from Sueanne Margarita, MD sent at 06/29/2021  1:52 PM EST ----- ?2D echo showed normal LV function with increased deafness of the heart muscle normal for his age and mildly dilated left atrium.  There is mild to moderate leakiness of the mitral valve and mild leakiness of the aortic valve with mild aortic stenosis.  The ascending aorta is mildly dilated.  Aortic valve gradient has slightly increased from 13 to 15 mmHg.  Please repeat echo in 1 year ?

## 2021-07-06 NOTE — Telephone Encounter (Signed)
Travis Margarita, MD  ?07/02/2021  8:58 AM EDT Back to Top  ?  ?Chest CTA showed mild ascending aortic aneurysm measuring 4.4cm and coronary and aortic atherosclerosis - repeat CTA in 1 year for aortic aneurysm  ? ?

## 2021-07-27 ENCOUNTER — Ambulatory Visit: Payer: Self-pay | Admitting: Cardiology

## 2021-07-30 ENCOUNTER — Other Ambulatory Visit (HOSPITAL_COMMUNITY): Payer: Self-pay

## 2021-07-31 ENCOUNTER — Other Ambulatory Visit (HOSPITAL_COMMUNITY): Payer: Self-pay

## 2021-09-19 ENCOUNTER — Other Ambulatory Visit (HOSPITAL_COMMUNITY): Payer: Self-pay

## 2021-10-17 ENCOUNTER — Other Ambulatory Visit (HOSPITAL_COMMUNITY): Payer: Self-pay

## 2021-10-17 DIAGNOSIS — R7301 Impaired fasting glucose: Secondary | ICD-10-CM | POA: Diagnosis not present

## 2021-10-17 DIAGNOSIS — Z951 Presence of aortocoronary bypass graft: Secondary | ICD-10-CM | POA: Diagnosis not present

## 2021-10-17 DIAGNOSIS — I77810 Thoracic aortic ectasia: Secondary | ICD-10-CM | POA: Diagnosis not present

## 2021-10-17 DIAGNOSIS — N402 Nodular prostate without lower urinary tract symptoms: Secondary | ICD-10-CM | POA: Diagnosis not present

## 2021-10-17 DIAGNOSIS — I700 Atherosclerosis of aorta: Secondary | ICD-10-CM | POA: Diagnosis not present

## 2021-10-17 DIAGNOSIS — E7800 Pure hypercholesterolemia, unspecified: Secondary | ICD-10-CM | POA: Diagnosis not present

## 2021-10-17 DIAGNOSIS — G720 Drug-induced myopathy: Secondary | ICD-10-CM | POA: Diagnosis not present

## 2021-10-17 DIAGNOSIS — Z23 Encounter for immunization: Secondary | ICD-10-CM | POA: Diagnosis not present

## 2021-10-17 DIAGNOSIS — I10 Essential (primary) hypertension: Secondary | ICD-10-CM | POA: Diagnosis not present

## 2021-10-17 DIAGNOSIS — F330 Major depressive disorder, recurrent, mild: Secondary | ICD-10-CM | POA: Diagnosis not present

## 2021-10-17 DIAGNOSIS — Z0000 Encounter for general adult medical examination without abnormal findings: Secondary | ICD-10-CM | POA: Diagnosis not present

## 2021-10-17 DIAGNOSIS — I2510 Atherosclerotic heart disease of native coronary artery without angina pectoris: Secondary | ICD-10-CM | POA: Diagnosis not present

## 2021-10-17 MED ORDER — BUPROPION HCL ER (SR) 150 MG PO TB12
150.0000 mg | ORAL_TABLET | Freq: Every morning | ORAL | 4 refills | Status: DC
  Filled 2021-10-17: qty 90, 90d supply, fill #0
  Filled 2022-04-10: qty 90, 90d supply, fill #1
  Filled 2022-07-07: qty 90, 90d supply, fill #2
  Filled 2022-10-07: qty 90, 90d supply, fill #3

## 2021-10-17 MED ORDER — HYDROCHLOROTHIAZIDE 12.5 MG PO TABS
12.5000 mg | ORAL_TABLET | Freq: Every morning | ORAL | 4 refills | Status: DC
  Filled 2021-10-17: qty 90, 90d supply, fill #0
  Filled 2021-12-14 – 2022-01-13 (×2): qty 90, 90d supply, fill #1
  Filled 2022-04-10: qty 90, 90d supply, fill #2
  Filled 2022-07-07: qty 90, 90d supply, fill #3
  Filled 2022-10-07: qty 90, 90d supply, fill #4

## 2021-12-14 ENCOUNTER — Other Ambulatory Visit (HOSPITAL_COMMUNITY): Payer: Self-pay

## 2021-12-14 MED ORDER — METOPROLOL SUCCINATE ER 50 MG PO TB24
50.0000 mg | ORAL_TABLET | Freq: Every day | ORAL | 4 refills | Status: DC
  Filled 2021-12-14: qty 90, 90d supply, fill #0
  Filled 2022-04-10: qty 90, 90d supply, fill #1
  Filled 2022-07-07: qty 90, 90d supply, fill #2
  Filled 2022-10-07: qty 90, 90d supply, fill #3

## 2021-12-15 ENCOUNTER — Other Ambulatory Visit (HOSPITAL_COMMUNITY): Payer: Self-pay

## 2021-12-17 ENCOUNTER — Other Ambulatory Visit (HOSPITAL_COMMUNITY): Payer: Self-pay

## 2022-01-14 ENCOUNTER — Other Ambulatory Visit (HOSPITAL_COMMUNITY): Payer: Self-pay

## 2022-01-28 DIAGNOSIS — H2512 Age-related nuclear cataract, left eye: Secondary | ICD-10-CM | POA: Diagnosis not present

## 2022-01-30 DIAGNOSIS — I10 Essential (primary) hypertension: Secondary | ICD-10-CM | POA: Diagnosis not present

## 2022-02-07 ENCOUNTER — Other Ambulatory Visit (HOSPITAL_COMMUNITY): Payer: Self-pay

## 2022-02-07 MED ORDER — HYDROCODONE-ACETAMINOPHEN 5-325 MG PO TABS
1.0000 | ORAL_TABLET | ORAL | 0 refills | Status: DC
  Filled 2022-02-07: qty 14, 3d supply, fill #0

## 2022-02-07 MED ORDER — PENICILLIN V POTASSIUM 500 MG PO TABS
500.0000 mg | ORAL_TABLET | Freq: Four times a day (QID) | ORAL | 0 refills | Status: DC
  Filled 2022-02-07: qty 28, 7d supply, fill #0

## 2022-04-10 ENCOUNTER — Other Ambulatory Visit (HOSPITAL_COMMUNITY): Payer: Self-pay

## 2022-04-10 ENCOUNTER — Other Ambulatory Visit: Payer: Self-pay

## 2022-04-10 MED ORDER — BUPROPION HCL ER (SR) 150 MG PO TB12
150.0000 mg | ORAL_TABLET | Freq: Every morning | ORAL | 4 refills | Status: DC
  Filled 2022-04-10: qty 90, 90d supply, fill #0

## 2022-04-11 ENCOUNTER — Other Ambulatory Visit (HOSPITAL_COMMUNITY): Payer: Self-pay

## 2022-04-17 ENCOUNTER — Other Ambulatory Visit (HOSPITAL_COMMUNITY): Payer: Self-pay

## 2022-05-02 ENCOUNTER — Other Ambulatory Visit (HOSPITAL_COMMUNITY): Payer: Self-pay

## 2022-05-06 ENCOUNTER — Telehealth: Payer: Self-pay | Admitting: *Deleted

## 2022-05-06 DIAGNOSIS — Z0189 Encounter for other specified special examinations: Secondary | ICD-10-CM

## 2022-05-06 DIAGNOSIS — I359 Nonrheumatic aortic valve disorder, unspecified: Secondary | ICD-10-CM

## 2022-05-06 DIAGNOSIS — I77810 Thoracic aortic ectasia: Secondary | ICD-10-CM

## 2022-05-06 NOTE — Telephone Encounter (Signed)
-----  Message from Travis Harrison sent at 05/06/2022  1:34 PM EST ----- Regarding: Need order Need order for BMET.  Patient has CT angio chest scheduled for 06-24-22

## 2022-05-06 NOTE — Telephone Encounter (Signed)
Order for BMET placed per CT Protocol. Will make [facility] Scheduler aware that order placed, so that she can reach out to the pt to arrange this lab appt prior to pts CT ANGIO CHEST AORTA.

## 2022-06-11 DIAGNOSIS — N401 Enlarged prostate with lower urinary tract symptoms: Secondary | ICD-10-CM | POA: Diagnosis not present

## 2022-06-11 DIAGNOSIS — N5201 Erectile dysfunction due to arterial insufficiency: Secondary | ICD-10-CM | POA: Diagnosis not present

## 2022-06-11 DIAGNOSIS — R102 Pelvic and perineal pain: Secondary | ICD-10-CM | POA: Diagnosis not present

## 2022-06-11 DIAGNOSIS — R3912 Poor urinary stream: Secondary | ICD-10-CM | POA: Diagnosis not present

## 2022-06-20 ENCOUNTER — Other Ambulatory Visit: Payer: Self-pay | Admitting: Cardiology

## 2022-06-20 ENCOUNTER — Other Ambulatory Visit (HOSPITAL_COMMUNITY): Payer: Self-pay

## 2022-06-20 DIAGNOSIS — E785 Hyperlipidemia, unspecified: Secondary | ICD-10-CM

## 2022-06-20 DIAGNOSIS — I2510 Atherosclerotic heart disease of native coronary artery without angina pectoris: Secondary | ICD-10-CM

## 2022-06-20 MED ORDER — REPATHA SURECLICK 140 MG/ML ~~LOC~~ SOAJ
1.0000 mL | SUBCUTANEOUS | 0 refills | Status: DC
  Filled 2022-06-20 – 2022-07-29 (×3): qty 6, 84d supply, fill #0

## 2022-06-21 ENCOUNTER — Other Ambulatory Visit (HOSPITAL_COMMUNITY): Payer: Self-pay

## 2022-06-24 ENCOUNTER — Ambulatory Visit: Payer: Commercial Managed Care - PPO

## 2022-06-24 ENCOUNTER — Other Ambulatory Visit (HOSPITAL_COMMUNITY): Payer: Self-pay

## 2022-06-25 ENCOUNTER — Other Ambulatory Visit (HOSPITAL_COMMUNITY): Payer: Self-pay

## 2022-06-26 ENCOUNTER — Other Ambulatory Visit (HOSPITAL_COMMUNITY): Payer: Self-pay

## 2022-06-27 ENCOUNTER — Other Ambulatory Visit (HOSPITAL_COMMUNITY): Payer: Self-pay

## 2022-07-01 ENCOUNTER — Other Ambulatory Visit (HOSPITAL_COMMUNITY): Payer: Self-pay

## 2022-07-03 ENCOUNTER — Ambulatory Visit: Payer: 59 | Attending: Cardiology

## 2022-07-03 DIAGNOSIS — Z0189 Encounter for other specified special examinations: Secondary | ICD-10-CM

## 2022-07-03 DIAGNOSIS — I359 Nonrheumatic aortic valve disorder, unspecified: Secondary | ICD-10-CM | POA: Diagnosis not present

## 2022-07-03 DIAGNOSIS — I77810 Thoracic aortic ectasia: Secondary | ICD-10-CM | POA: Diagnosis not present

## 2022-07-03 LAB — BASIC METABOLIC PANEL
BUN/Creatinine Ratio: 33 — ABNORMAL HIGH (ref 10–24)
BUN: 23 mg/dL (ref 8–27)
CO2: 27 mmol/L (ref 20–29)
Calcium: 9.8 mg/dL (ref 8.6–10.2)
Chloride: 102 mmol/L (ref 96–106)
Creatinine, Ser: 0.69 mg/dL — ABNORMAL LOW (ref 0.76–1.27)
Glucose: 86 mg/dL (ref 70–99)
Potassium: 4.3 mmol/L (ref 3.5–5.2)
Sodium: 139 mmol/L (ref 134–144)
eGFR: 98 mL/min/{1.73_m2} (ref >59)

## 2022-07-05 ENCOUNTER — Ambulatory Visit (HOSPITAL_BASED_OUTPATIENT_CLINIC_OR_DEPARTMENT_OTHER): Payer: 59

## 2022-07-05 ENCOUNTER — Ambulatory Visit (HOSPITAL_BASED_OUTPATIENT_CLINIC_OR_DEPARTMENT_OTHER)
Admission: RE | Admit: 2022-07-05 | Discharge: 2022-07-05 | Disposition: A | Discharge status: Home / Self Care | Payer: 59 | Source: Ambulatory Visit | Attending: Cardiology | Admitting: Cardiology

## 2022-07-05 DIAGNOSIS — I77810 Thoracic aortic ectasia: Secondary | ICD-10-CM | POA: Insufficient documentation

## 2022-07-05 DIAGNOSIS — I2510 Atherosclerotic heart disease of native coronary artery without angina pectoris: Secondary | ICD-10-CM | POA: Diagnosis not present

## 2022-07-05 DIAGNOSIS — I6523 Occlusion and stenosis of bilateral carotid arteries: Secondary | ICD-10-CM | POA: Diagnosis not present

## 2022-07-05 DIAGNOSIS — I351 Nonrheumatic aortic (valve) insufficiency: Secondary | ICD-10-CM | POA: Diagnosis not present

## 2022-07-05 DIAGNOSIS — I712 Thoracic aortic aneurysm, without rupture, unspecified: Secondary | ICD-10-CM | POA: Diagnosis not present

## 2022-07-05 LAB — ECHOCARDIOGRAM COMPLETE
AR max vel: 1.56 cm2
AV Area VTI: 1.52 cm2
AV Area mean vel: 1.53 cm2
AV Mean grad: 11 mmHg
AV Peak grad: 20.1 mmHg
AV Vena cont: 0.17 cm
Ao pk vel: 2.24 m/s
Area-P 1/2: 3.7 cm2
Calc EF: 62.2 %
P 1/2 time: 712 msec
S' Lateral: 2.72 cm
Single Plane A2C EF: 61.4 %
Single Plane A4C EF: 61.2 %

## 2022-07-05 MED ORDER — IOHEXOL 350 MG/ML SOLN
100.0000 mL | Freq: Once | INTRAVENOUS | Status: AC | PRN
  Administered 2022-07-05: 100 mL via INTRAVENOUS

## 2022-07-06 ENCOUNTER — Encounter: Payer: Self-pay | Admitting: Cardiology

## 2022-07-07 ENCOUNTER — Other Ambulatory Visit: Payer: Self-pay | Admitting: Cardiology

## 2022-07-08 ENCOUNTER — Other Ambulatory Visit (HOSPITAL_COMMUNITY): Payer: Self-pay

## 2022-07-08 MED ORDER — LISINOPRIL 40 MG PO TABS
40.0000 mg | ORAL_TABLET | Freq: Every day | ORAL | 3 refills | Status: DC
  Filled 2022-07-08: qty 90, 90d supply, fill #0
  Filled 2022-10-07: qty 90, 90d supply, fill #1
  Filled 2023-01-07 (×2): qty 90, 90d supply, fill #2

## 2022-07-09 ENCOUNTER — Telehealth: Payer: Self-pay

## 2022-07-09 ENCOUNTER — Other Ambulatory Visit (HOSPITAL_COMMUNITY): Payer: Self-pay

## 2022-07-09 ENCOUNTER — Other Ambulatory Visit: Payer: Self-pay

## 2022-07-09 ENCOUNTER — Encounter (HOSPITAL_COMMUNITY): Payer: Self-pay

## 2022-07-09 DIAGNOSIS — I359 Nonrheumatic aortic valve disorder, unspecified: Secondary | ICD-10-CM

## 2022-07-09 DIAGNOSIS — I77810 Thoracic aortic ectasia: Secondary | ICD-10-CM

## 2022-07-09 NOTE — Telephone Encounter (Signed)
-----   Message from Sueanne Margarita, MD sent at 07/06/2022  5:41 PM EDT ----- 2D echo showed normal heart function with EF 55 to 60% with mild thickening of the heart muscle related to hypertension, increased stiffness of the heart muscle also related to hypertension and aging.  Mildly enlarged right ventricle and left atrium.  There is trivial leakiness of the mitral valve normal for his age.  Stable mild aortic stenosis and mild leakiness of the aortic valve.  Ascending aorta measures 40 mm which is mildly enlarged (48mm on CTA 06/2022).  Please repeat 2D echo in 1 year for aortic stenosis and repeat chest CTA gated in 1 year for dilated aorta

## 2022-07-09 NOTE — Telephone Encounter (Signed)
Called by spouse (DPR) to discuss patient's results. Per Dr. Radford Pax, 2D echo showed normal heart function with EF 55 to 60% with mild thickening of the heart muscle related to hypertension, increased stiffness of the heart muscle also related to hypertension and aging.  Mildly enlarged right ventricle and left atrium.  There is trivial leakiness of the mitral valve normal for his age.  Stable mild aortic stenosis and mild leakiness of the aortic valve.  Ascending aorta measures 40 mm which is mildly enlarged (23mm on CTA 06/2022).  Spouse Larrie verbalizes understanding and agrees to plan to repeat 2D echo in 1 year for aortic stenosis and repeat chest CTA gated in 1 year for dilated aorta. Orders Placed.

## 2022-07-09 NOTE — Telephone Encounter (Signed)
Left message on spouse's cell phone per DPR for patient to call office.

## 2022-07-09 NOTE — Telephone Encounter (Signed)
-----   Message from Sueanne Margarita, MD sent at 07/06/2022  5:41 PM EDT ----- 2D echo showed normal heart function with EF 55 to 60% with mild thickening of the heart muscle related to hypertension, increased stiffness of the heart muscle also related to hypertension and aging.  Mildly enlarged right ventricle and left atrium.  There is trivial leakiness of the mitral valve normal for his age.  Stable mild aortic stenosis and mild leakiness of the aortic valve.  Ascending aorta measures 40 mm which is mildly enlarged (51mm on CTA 06/2022).  Please repeat 2D echo in 1 year for aortic stenosis and repeat chest CTA gated in 1 year for dilated aorta

## 2022-07-10 ENCOUNTER — Other Ambulatory Visit: Payer: Self-pay

## 2022-07-11 ENCOUNTER — Other Ambulatory Visit (HOSPITAL_COMMUNITY): Payer: Self-pay

## 2022-07-15 NOTE — Progress Notes (Deleted)
Cardiology Office Note:    Date:  07/15/2022   ID:  Travis Harrison, DOB 19-Apr-1951, MRN [id number]  PCP:  Wenda Low, [place] Providers Cardiologist:  Fransico Him, MD { Click to update primary MD,subspecialty MD or APP then REFRESH:1}  *** Referring MD: Wenda Low, MD   Chief Complaint:  No chief complaint on file. {Click here for Visit Info    :1}    History of Present Illness:   Travis Harrison is a 72 y.o. male with a hx of HTN, dyslipidemia, moderate carotid artery stenosis and ASCAD s/p CABG 2015 with a L-LAD and RIMA-OM1, mild AS, dilated ascending aorta -4.3 cm CTA 06/2022.  Patient saw Dr. Radford Pax 05/2021 and gets annual echo and CTA. See below for details.      Past Medical History:  Diagnosis Date   Aortic valve disease    Mild aortic stenosi with mild AR by echo 2024   Ascending aorta dilatation ([place]) 05/26/2018   4m by echo 06/2022  and 95m by CTA 06/2022   Carotid artery stenosis, asymptomatic    1-39% bilateral stenosis by dopplers 06/2021   Hyperlipidemia    Hypertension    S/P CABG x 2 03/04/2014   LIMA to LAD and RIMA to OM1   Current Medications: No outpatient medications have been marked as taking for the 07/24/22 encounter (Appointment) with Imogene Burn, PA-C.    Allergies:   Patient has no known allergies.   Social History   Tobacco Use   Smoking status: Former    Types: Cigarettes    Quit date: 03/04/1999    Years since quitting: 23.3   Smokeless tobacco: Never  Vaping Use   Vaping Use: Never used  Substance Use Topics   Alcohol use: Yes    Alcohol/week: 6.0 standard drinks of alcohol    Types: 6 Cans of beer per week   Drug use: Not Currently    Family Hx: The patient's family history includes CVA in his father; Heart attack in his mother; Heart attack (age of onset: 86) in his father; Heart disease in his father and mother; Hypertension in his father; Stomach cancer in his mother. There is no history of Colon  cancer, Colon polyps, Esophageal cancer, or Rectal cancer.  ROS     Physical Exam:    VS:  There were no vitals taken for this visit.    Wt Readings from Last 3 Encounters:  05/24/21 153 lb 9.6 oz (69.7 kg)  06/08/20 151 lb 3.2 oz (68.6 kg)  06/03/19 153 lb 9.6 oz (69.7 kg)    Physical Exam  GEN: Well nourished, well developed, in no acute distress  HEENT: normal  Neck: no JVD, carotid bruits, or masses Cardiac:RRR; no murmurs, rubs, or gallops  Respiratory:  clear to auscultation bilaterally, normal work of breathing GI: soft, nontender, nondistended, + BS Ext: without cyanosis, clubbing, or edema, Good distal pulses bilaterally MS: no deformity or atrophy  Skin: warm and dry, no rash Neuro:  Alert and Oriented x 3, Strength and sensation are intact Psych: euthymic mood, full affect        EKGs/Labs/Other Test Reviewed:    EKG:  EKG is *** ordered today.  The ekg ordered today demonstrates ***  Recent Labs: 07/03/2022: BUN 23; Creatinine, Ser 0.69; Potassium 4.3; Sodium 139   Recent Lipid Panel No results for input(s): "CHOL", "TRIG", "HDL", "VLDL", "LDLCALC", "LDLDIRECT" in the last 8760 hours.   Prior CV Studies: ECHO  COMPLETE WO IMAGING ENHANCING AGENT 07/05/2022  Narrative ECHOCARDIOGRAM REPORT    Patient Name:   Travis Harrison Date of Exam: 07/05/2022 Medical Rec #:  [id number]       Height:       68.0 in Accession #:    [id number]      Weight:       153.6 lb Date of Birth:  1950/05/29       BSA:          1.827 m Patient Age:    41 years        BP:           145/80 mmHg Patient Gender: M               HR:           53 bpm. Exam Location:  Outpatient  Procedure: 2D Echo, 3D Echo, Cardiac Doppler, Color Doppler and Strain Analysis  Indications:    I35.2 Nonrheumatic aortic (valve) stenosis with insufficiency  History:        Patient has prior history of Echocardiogram examinations, most recent 06/29/2021. CAD, Prior CABG; Risk  Factors:Hypertension, Dyslipidemia and Former Smoker. Patient denies chest pain, SOB and leg edema.  Sonographer:    Salvadore Dom RVT, RDCS (AE), RDMS Referring Phys: 16 TRACI R TURNER   Sonographer Comments: Difficult to see ascending aorta, lung interference. IMPRESSIONS   1. Left ventricular ejection fraction, by estimation, is 55 to 60%. The left ventricle has normal function. The left ventricle has no regional wall motion abnormalities. There is mild concentric left ventricular hypertrophy. Left ventricular diastolic parameters are consistent with Grade I diastolic dysfunction (impaired relaxation). Elevated left atrial pressure. The average left ventricular global longitudinal strain is -16.9 %. The global longitudinal strain is normal. 2. Right ventricular systolic function is normal. The right ventricular size is mildly enlarged. 3. Left atrial size was mildly dilated. 4. The mitral valve is normal in structure. Trivial mitral valve regurgitation. No evidence of mitral stenosis. 5. The aortic valve is tricuspid. Aortic valve regurgitation is mild. Mild aortic valve stenosis. 6. Aortic dilatation noted. There is mild dilatation of the ascending aorta, measuring 40 mm. 7. The inferior vena cava is normal in size with greater than 50% respiratory variability, suggesting right atrial pressure of 3 mmHg.  Comparison(s): EF 55%, mild AS mean 15 peak 27.9 mmHg, mild AI, asc aor 40 mm.  FINDINGS Left Ventricle: Left ventricular ejection fraction, by estimation, is 55 to 60%. The left ventricle has normal function. The left ventricle has no regional wall motion abnormalities. The average left ventricular global longitudinal strain is -16.9 %. The global longitudinal strain is normal. The left ventricular internal cavity size was normal in size. There is mild concentric left ventricular hypertrophy. Left ventricular diastolic parameters are consistent with Grade I diastolic  dysfunction (impaired relaxation). Elevated left atrial pressure.  Right Ventricle: The right ventricular size is mildly enlarged. Right ventricular systolic function is normal.  Left Atrium: Left atrial size was mildly dilated.  Right Atrium: Right atrial size was normal in size.  Pericardium: There is no evidence of pericardial effusion.  Mitral Valve: The mitral valve is normal in structure. Mild mitral annular calcification. Trivial mitral valve regurgitation. No evidence of mitral valve stenosis.  Tricuspid Valve: The tricuspid valve is normal in structure. Tricuspid valve regurgitation is trivial. No evidence of tricuspid stenosis.  Aortic Valve: The aortic valve is tricuspid. Aortic valve regurgitation is mild. Aortic regurgitation PHT measures 712  msec. Mild aortic stenosis is present. Aortic valve mean gradient measures 11.0 mmHg. Aortic valve peak gradient measures 20.1 mmHg. Aortic valve area, by VTI measures 1.52 cm.  Pulmonic Valve: The pulmonic valve was normal in structure. Pulmonic valve regurgitation is trivial. No evidence of pulmonic stenosis.  Aorta: Aortic dilatation noted. There is mild dilatation of the ascending aorta, measuring 40 mm.  Venous: The inferior vena cava is normal in size with greater than 50% respiratory variability, suggesting right atrial pressure of 3 mmHg.  IAS/Shunts: No atrial level shunt detected by color flow Doppler.   LEFT VENTRICLE PLAX 2D LVIDd:         4.35 cm      Diastology LVIDs:         2.72 cm      LV e' medial:    4.46 cm/s LV PW:         1.19 cm      LV E/e' medial:  20.5 LV IVS:        1.29 cm      LV e' lateral:   6.89 cm/s LVOT diam:     2.30 cm      LV E/e' lateral: 13.3 LV SV:         86 LV SV Index:   47           2D Longitudinal Strain LVOT Area:     4.15 cm     2D Strain GLS Avg:     -16.9 %  LV Volumes (MOD) LV vol d, MOD A2C: 70.2 ml  3D Volume EF: LV vol d, MOD A4C: 102.0 ml 3D EF:        60 % LV vol s,  MOD A2C: 27.1 ml  LV EDV:       133 ml LV vol s, MOD A4C: 39.6 ml  LV ESV:       53 ml LV SV MOD A2C:     43.1 ml  LV SV:        79 ml LV SV MOD A4C:     102.0 ml LV SV MOD BP:      54.5 ml  RIGHT VENTRICLE RV S prime:     9.81 cm/s TAPSE (M-mode): 1.8 cm  LEFT ATRIUM             Index        RIGHT ATRIUM           Index LA diam:        3.70 cm 2.03 cm/m   RA Area:     14.90 cm LA Vol (A2C):   59.3 ml 32.46 ml/m  RA Volume:   33.80 ml  18.50 ml/m LA Vol (A4C):   59.0 ml 32.30 ml/m LA Biplane Vol: 63.4 ml 34.70 ml/m AORTIC VALVE                     PULMONIC VALVE AV Area (Vmax):    1.56 cm      PV Vmax:          0.91 m/s AV Area (Vmean):   1.53 cm      PV Peak grad:     3.3 mmHg AV Area (VTI):     1.52 cm      PR End Diast Vel: 4.93 msec AV Vmax:           224.00 cm/s AV Vmean:          158.333 cm/s  AV VTI:            0.562 m AV Peak Grad:      20.1 mmHg AV Mean Grad:      11.0 mmHg LVOT Vmax:         84.20 cm/s LVOT Vmean:        58.300 cm/s LVOT VTI:          0.206 m LVOT/AV VTI ratio: 0.37 AI PHT:            712 msec AR Vena Contracta: 0.17 cm  AORTA Ao Root diam: 3.60 cm Ao Asc diam:  3.60 cm Ao Arch diam: 3.1 cm  MITRAL VALVE               TRICUSPID VALVE MV Area (PHT): 3.70 cm    TR Peak grad:   24.0 mmHg MV Decel Time: 205 msec    TR Vmax:        245.00 cm/s MV E velocity: 91.60 cm/s MV A velocity: 79.00 cm/s  SHUNTS MV E/A ratio:  1.16        Systemic VTI:  0.21 m Systemic Diam: 2.30 cm  Kirk Ruths MD Electronically signed by Kirk Ruths MD Signature Date/Time: 07/05/2022/3:06:09 PM    Final     CTA 06/2022 IMPRESSION: Relatively unchanged size and configuration of the ascending aorta, estimated 4.3 cm on the CT angiogram. Recommend annual imaging followup by CTA or MRA. This recommendation follows 2010 ACCF/AHA/AATS/ACR/ASA/SCA/SCAI/SIR/STS/SVM Guidelines for the Diagnosis and Management of Patients with Thoracic Aortic  Disease. Circulation. 2010; 121JN:9224643. Aortic aneurysm NOS (ICD10-I71.9)   Aortic atherosclerosis. Surgical changes of median sternotomy and CABG. Aortic Atherosclerosis (ICD10-I70.0).   Signed,   Dulcy Fanny. Nadene Rubins, RPVI   [facility]   [facility]     Risk Assessment/Calculations/Metrics:   {Does this patient have ATRIAL FIBRILLATION?:[id number]}     No BP recorded.  {Refresh Note OR Click here to enter BP  :1}***    ASSESSMENT & PLAN:   No problem-specific Assessment & Plan notes found for this encounter.    ASCAD - s/p CABG 2015 with a L-LAD and RIMA-OM1.   -he has not had any anginal sx since I saw him last -continue ASA 81mg  daily, BB and repatha -he is statin intolerant     HTN  -BP borderline controlled today>>goal is < 130/89mmHg given his dilated aorta -increase Lisinopril to 40mg  daily -check BMET in 1 week -continue prescription drug management with Toprol XL 50mg  daily with PRN refills    Carotid artery stenosis -dopplers 1-39% bilateral by dopplers 06/2021 -continue Repatha and ASA -repeat dopplers    Hyperlipidemia  - LDL goal is < 70.  -continue prescription drug management with Repatha -he is statin intolerant  -check FLP and ALT    Dilated aortic root -his aortic root dimension was 11mm by 2D echo 07/05/2022 and 21mm by Chest CT 06/2022 -continue statin  -BP needs better control -repeat Chest CTA 1 yr     Aortic stenosis/AI -mild AS 06/2022 -repeat echo 1 yr          {Are you ordering a CV Procedure (e.g. stress test, cath, DCCV, TEE, etc)?   Press F2        :[id number]   Dispo:  No follow-ups on file.   Medication Adjustments/Labs and Tests Ordered: Current medicines are reviewed at length with the patient today.  Concerns regarding medicines are outlined above.  Tests Ordered: No orders  of the defined types were placed in this encounter.  Medication Changes: No orders of  the defined types were placed in this encounter.  Sumner Boast, PA-C  07/15/2022 8:27 AM    [facility] [place], [place], [place]  [address] Phone: [phone]; Fax: [phone]

## 2022-07-21 ENCOUNTER — Other Ambulatory Visit (HOSPITAL_COMMUNITY): Payer: Self-pay

## 2022-07-23 ENCOUNTER — Other Ambulatory Visit (HOSPITAL_COMMUNITY): Payer: Self-pay

## 2022-07-24 ENCOUNTER — Other Ambulatory Visit (HOSPITAL_COMMUNITY): Payer: Self-pay

## 2022-07-24 ENCOUNTER — Ambulatory Visit: Payer: 59 | Admitting: Physician Assistant

## 2022-07-24 ENCOUNTER — Ambulatory Visit: Payer: 59 | Admitting: Cardiology

## 2022-07-25 ENCOUNTER — Other Ambulatory Visit (HOSPITAL_COMMUNITY): Payer: Self-pay

## 2022-07-29 ENCOUNTER — Other Ambulatory Visit (HOSPITAL_COMMUNITY): Payer: Self-pay

## 2022-09-02 NOTE — Progress Notes (Signed)
Cardiology Office Note:    Date:  09/10/2022   ID:  Travis Harrison, DOB 02/22/1951, MRN [id number]  PCP:  Travis Housekeeper, MD  [facility] Providers Cardiologist:  Travis Magic, MD     Referring MD: Travis Housekeeper, MD   Chief Complaint:  Follow-up     History of Present Illness:   Travis Harrison is a 72 y.o. male with  a hx of HTN, dyslipidemia, moderate carotid artery stenosis and ASCAD s/p CABG 2015 with a L-LAD and RIMA-OM1, mild AS, dilated ascending aorta -4.3 cm CTA 06/2022.  Patient saw Travis Harrison 05/2021 and gets annual echo and CTA. See below for details.     Patient comes in for f/u. Walks over 2 miles daily in the summer. Denies chest pain, dyspnea, dizziness, edema. BP up today-always is when he comes in. . At home 128-135/70's. Has been vegetarian for over a year now.       Past Medical History:  Diagnosis Date   Aortic valve disease    Mild aortic stenosi with mild AR by echo 2024   Ascending aorta dilatation ([facility]) 05/26/2018   64m by echo 06/2022  and 35m by CTA 06/2022   Carotid artery stenosis, asymptomatic    1-39% bilateral stenosis by dopplers 06/2021   Hyperlipidemia    Hypertension    S/P CABG x 2 03/04/2014   LIMA to LAD and RIMA to OM1   Current Medications: Current Meds  Medication Sig   aspirin 81 MG EC tablet Take 1 tablet (81 mg total) by mouth daily.   buPROPion (WELLBUTRIN SR) 150 MG 12 hr tablet Take 1 tablet (150 mg total) by mouth in the morning.   Evolocumab (REPATHA SURECLICK) 140 MG/ML SOAJ Inject 140 mg as directed every 14 days.   hydrochlorothiazide (HYDRODIURIL) 12.5 MG tablet Take 1 tablet (12.5 mg total) by mouth every morning.   lisinopril (ZESTRIL) 40 MG tablet Take 1 tablet (40 mg total) by mouth daily.   metoprolol succinate (TOPROL-XL) 50 MG 24 hr tablet Take 1 tablet by mouth Once a day    Allergies:   Patient has no known allergies.   Social History   Tobacco Use   Smoking status: Former    Types: Cigarettes     Quit date: 03/04/1999    Years since quitting: 23.5   Smokeless tobacco: Never  Vaping Use   Vaping Use: Never used  Substance Use Topics   Alcohol use: Yes    Alcohol/week: 6.0 standard drinks of alcohol    Types: 6 Cans of beer per week   Drug use: Not Currently    Family Hx: The patient's family history includes CVA in his father; Heart attack in his mother; Heart attack (age of onset: 30) in his father; Heart disease in his father and mother; Hypertension in his father; Stomach cancer in his mother. There is no history of Colon cancer, Colon polyps, Esophageal cancer, or Rectal cancer.  ROS     Physical Exam:    VS:  BP (!) 160/85   Pulse 97   Ht 5' 8.5" (1.74 m)   Wt 157 lb (71.2 kg)   SpO2 (!) 66%   BMI 23.52 kg/m     Wt Readings from Last 3 Encounters:  09/10/22 157 lb (71.2 kg)  05/24/21 153 lb 9.6 oz (69.7 kg)  06/08/20 151 lb 3.2 oz (68.6 kg)    Physical Exam  GEN: Thin, in no acute distress  Neck: no JVD, carotid  bruits, or masses Cardiac:RRR; 2/6 systolic murmur LSB Respiratory:  clear to auscultation bilaterally, normal work of breathing GI: soft, nontender, nondistended, + BS Ext: without cyanosis, clubbing, or edema, Good distal pulses bilaterally Neuro:  Alert and Oriented x 3,  Psych: euthymic mood, full affect        EKGs/Labs/Other Test Reviewed:    EKG:  EKG is  ordered today.  The ekg ordered today demonstrates NSR with first degree AV block, moderate LVH.  Recent Labs: 07/03/2022: BUN 23; Creatinine, Ser 0.69; Potassium 4.3; Sodium 139   Recent Lipid Panel No results for input(s): "CHOL", "TRIG", "HDL", "VLDL", "LDLCALC", "LDLDIRECT" in the last 8760 hours.   Prior CV Studies:    IMPRESSION: Relatively unchanged size and configuration of the ascending aorta, estimated 4.3 cm on the CT angiogram. Recommend annual imaging followup by CTA or MRA. This recommendation follows 2010 ACCF/AHA/AATS/ACR/ASA/SCA/SCAI/SIR/STS/SVM Guidelines for  the Diagnosis and Management of Patients with Thoracic Aortic Disease. Circulation. 2010; 121: G956-O130. Aortic aneurysm NOS (ICD10-I71.9)   Aortic atherosclerosis. Surgical changes of median sternotomy and CABG. Aortic Atherosclerosis (ICD10-I70.0).   Signed,   Travis Harrison, RPVI   [facility]   [facility] Radiology    IMPRESSIONS     1. Left ventricular ejection fraction, by estimation, is 55 to 60%. The  left ventricle has normal function. The left ventricle has no regional  wall motion abnormalities. There is mild concentric left ventricular  hypertrophy. Left ventricular diastolic  parameters are consistent with Grade I diastolic dysfunction (impaired  relaxation). Elevated left atrial pressure. The average left ventricular  global longitudinal strain is -16.9 %. The global longitudinal strain is  normal.   2. Right ventricular systolic function is normal. The right ventricular  size is mildly enlarged.   3. Left atrial size was mildly dilated.   4. The mitral valve is normal in structure. Trivial mitral valve  regurgitation. No evidence of mitral stenosis.   5. The aortic valve is tricuspid. Aortic valve regurgitation is mild.  Mild aortic valve stenosis.   6. Aortic dilatation noted. There is mild dilatation of the ascending  aorta, measuring 40 mm.   7. The inferior vena cava is normal in size with greater than 50%  respiratory variability, suggesting right atrial pressure of 3 mmHg.   Comparison(s): EF 55%, mild AS mean 15 peak 27.9 mmHg, mild AI, asc aor 40  mm.     Risk Assessment/Calculations/Metrics:         HYPERTENSION CONTROL Vitals:   09/10/22 0737 09/10/22 0801  BP: (!) 158/90 (!) 160/85    The patient's blood pressure is elevated above target today.  In order to address the patient's elevated BP: Blood pressure will be monitored at home to determine if medication changes need to be made.; The  blood pressure is usually elevated in clinic.  Blood pressures monitored at home have been optimal.       ASSESSMENT & PLAN:   No problem-specific Assessment & Plan notes found for this encounter.    ASCAD - s/p CABG 2015 with a L-LAD and RIMA-OM1.  No angina. Walks daily. -continue ASA 81mg  daily, BB and repatha -he is statin intolerant     HTN  -BP high today but hasn't taken meds yet. Controlled at home>>goal is < 130/27mmHg given his dilated aorta -check BP daily for 2 weeks and send results to [place]. -continue Lisinopril and Toprol XL 50mg  daily   -2 gm sodium diet.  Carotid artery stenosis -dopplers 1-39% bilateral by dopplers 06/2021 -continue Repatha and ASA      Hyperlipidemia  - LDL goal is < 70.  -continue prescription drug management with Repatha -he is statin intolerant  -Patient to have FLP at PCP in July and send [place] results    Dilated aortic root -his aortic root dimension was 40mm by 2D echo 07/05/2022 and 44mm by Chest CT 06/2022 -continue statin  -BP needs better control -repeat Chest CTA 1 yr     Aortic stenosis/AI -mild AS 06/2022 -repeat echo 1 yr            Dispo:  No follow-ups on file.   Medication Adjustments/Labs and Tests Ordered: Current medicines are reviewed at length with the patient today.  Concerns regarding medicines are outlined above.  Tests Ordered: Orders Placed This Encounter  Procedures   EKG 12-Lead   Medication Changes: No orders of the defined types were placed in this encounter.  Elson Clan, PA-C  09/10/2022 8:01 AM    [facility] [address] [place], [place], [place]  [address] Phone: [phone]; Fax: [phone]

## 2022-09-10 ENCOUNTER — Ambulatory Visit: Payer: 59 | Attending: Cardiology | Admitting: Physician Assistant

## 2022-09-10 ENCOUNTER — Encounter: Payer: Self-pay | Admitting: Physician Assistant

## 2022-09-10 VITALS — BP 160/85 | HR 97 | Ht 68.5 in | Wt 157.0 lb

## 2022-09-10 DIAGNOSIS — I6523 Occlusion and stenosis of bilateral carotid arteries: Secondary | ICD-10-CM | POA: Diagnosis not present

## 2022-09-10 DIAGNOSIS — E785 Hyperlipidemia, unspecified: Secondary | ICD-10-CM

## 2022-09-10 DIAGNOSIS — I2510 Atherosclerotic heart disease of native coronary artery without angina pectoris: Secondary | ICD-10-CM

## 2022-09-10 DIAGNOSIS — I10 Essential (primary) hypertension: Secondary | ICD-10-CM | POA: Diagnosis not present

## 2022-09-10 DIAGNOSIS — I359 Nonrheumatic aortic valve disorder, unspecified: Secondary | ICD-10-CM | POA: Diagnosis not present

## 2022-09-10 DIAGNOSIS — I77810 Thoracic aortic ectasia: Secondary | ICD-10-CM | POA: Diagnosis not present

## 2022-09-10 NOTE — Patient Instructions (Addendum)
Medication Instructions:  Your physician recommends that you continue on your current medications as directed. Please refer to the Current Medication list given to you today.  *If you need a refill on your cardiac medications before your next appointment, please call your pharmacy*   Lab Work: Lipids with Primary care doctor in July If you have labs (blood work) drawn today and your tests are completely normal, you will receive your results only by: MyChart Message (if you have MyChart) OR A paper copy in the mail If you have any lab test that is abnormal or we need to change your treatment, we will call you to review the results.   Follow-Up: At [facility], you and your health needs are our priority.  As part of our continuing mission to provide you with exceptional heart care, we have created designated Provider Care Teams.  These Care Teams include your primary Cardiologist (physician) and Advanced Practice Providers (APPs -  Physician Assistants and Nurse Practitioners) who all work together to provide you with the care you need, when you need it.    Your next appointment:   1 year(s)  Provider:   Armanda Magic, MD    Other Instructions HOW TO TAKE YOUR BLOOD PRESSURE  Rest 5 minutes before taking your blood pressure. Don't  smoke or drink caffeinated beverages for at least 30 minutes before. Take your blood pressure before (not after) you eat. Sit comfortably with your back supported and both feet on the floor ( don't cross your legs). Elevate your arm to heart level on a table or a desk. Use the proper sized cuff.  It should fit smoothly and snugly around your bare upper arm.  There should be  Enough room to slip a fingertip under the cuff.  The bottom edge of the cuff should be 1 inch above the crease Of the elbow. Please monitor your blood pressure once daily 2 hours after your am medication. Check for 2 weeks daily and send results in Mychart message.      ----Avoid cold medicines with D or DM at the end of them----

## 2022-10-31 ENCOUNTER — Other Ambulatory Visit (HOSPITAL_COMMUNITY): Payer: Self-pay

## 2022-10-31 DIAGNOSIS — Z951 Presence of aortocoronary bypass graft: Secondary | ICD-10-CM | POA: Diagnosis not present

## 2022-10-31 DIAGNOSIS — F330 Major depressive disorder, recurrent, mild: Secondary | ICD-10-CM | POA: Diagnosis not present

## 2022-10-31 DIAGNOSIS — I77810 Thoracic aortic ectasia: Secondary | ICD-10-CM | POA: Diagnosis not present

## 2022-10-31 DIAGNOSIS — N529 Male erectile dysfunction, unspecified: Secondary | ICD-10-CM | POA: Diagnosis not present

## 2022-10-31 DIAGNOSIS — Z86010 Personal history of colonic polyps: Secondary | ICD-10-CM | POA: Diagnosis not present

## 2022-10-31 DIAGNOSIS — I10 Essential (primary) hypertension: Secondary | ICD-10-CM | POA: Diagnosis not present

## 2022-10-31 DIAGNOSIS — I779 Disorder of arteries and arterioles, unspecified: Secondary | ICD-10-CM | POA: Diagnosis not present

## 2022-10-31 DIAGNOSIS — N402 Nodular prostate without lower urinary tract symptoms: Secondary | ICD-10-CM | POA: Diagnosis not present

## 2022-10-31 DIAGNOSIS — E7800 Pure hypercholesterolemia, unspecified: Secondary | ICD-10-CM | POA: Diagnosis not present

## 2022-10-31 DIAGNOSIS — G720 Drug-induced myopathy: Secondary | ICD-10-CM | POA: Diagnosis not present

## 2022-10-31 DIAGNOSIS — R7301 Impaired fasting glucose: Secondary | ICD-10-CM | POA: Diagnosis not present

## 2022-10-31 DIAGNOSIS — Z0000 Encounter for general adult medical examination without abnormal findings: Secondary | ICD-10-CM | POA: Diagnosis not present

## 2022-10-31 MED ORDER — BUPROPION HCL ER (SR) 150 MG PO TB12
150.0000 mg | ORAL_TABLET | Freq: Every morning | ORAL | 5 refills | Status: DC
  Filled 2023-01-07 (×2): qty 90, 90d supply, fill #0
  Filled 2023-04-05: qty 90, 90d supply, fill #1
  Filled 2023-07-04: qty 90, 90d supply, fill #2
  Filled 2023-10-10: qty 90, 90d supply, fill #3

## 2022-10-31 MED ORDER — METOPROLOL SUCCINATE ER 50 MG PO TB24
50.0000 mg | ORAL_TABLET | Freq: Every day | ORAL | 5 refills | Status: DC
  Filled 2023-01-07 (×2): qty 90, 90d supply, fill #0
  Filled 2023-04-05: qty 90, 90d supply, fill #1
  Filled 2023-07-04: qty 90, 90d supply, fill #2
  Filled 2023-10-10: qty 90, 90d supply, fill #3

## 2022-10-31 MED ORDER — AMLODIPINE-VALSARTAN-HCTZ 5-160-12.5 MG PO TABS
1.0000 | ORAL_TABLET | Freq: Every day | ORAL | 5 refills | Status: DC
  Filled 2022-10-31: qty 90, 90d supply, fill #0

## 2022-11-01 ENCOUNTER — Other Ambulatory Visit (HOSPITAL_COMMUNITY): Payer: Self-pay

## 2022-11-04 ENCOUNTER — Other Ambulatory Visit (HOSPITAL_COMMUNITY): Payer: Self-pay

## 2022-11-26 ENCOUNTER — Other Ambulatory Visit (HOSPITAL_COMMUNITY): Payer: Self-pay

## 2022-11-26 MED ORDER — LISINOPRIL 40 MG PO TABS
40.0000 mg | ORAL_TABLET | Freq: Every morning | ORAL | 3 refills | Status: DC
  Filled 2022-11-26 – 2023-04-05 (×2): qty 90, 90d supply, fill #0
  Filled 2023-07-04: qty 90, 90d supply, fill #1
  Filled 2023-10-10: qty 90, 90d supply, fill #2

## 2022-11-26 MED ORDER — AMLODIPINE BESYLATE 5 MG PO TABS
5.0000 mg | ORAL_TABLET | Freq: Every evening | ORAL | 3 refills | Status: DC
  Filled 2022-11-26: qty 90, 90d supply, fill #0

## 2022-11-26 MED ORDER — HYDROCHLOROTHIAZIDE 12.5 MG PO TABS
12.5000 mg | ORAL_TABLET | Freq: Every morning | ORAL | 3 refills | Status: DC
  Filled 2022-11-26 – 2023-01-07 (×3): qty 90, 90d supply, fill #0
  Filled 2023-04-05: qty 90, 90d supply, fill #1
  Filled 2023-07-04: qty 90, 90d supply, fill #2
  Filled 2023-10-10: qty 90, 90d supply, fill #3

## 2023-01-07 ENCOUNTER — Other Ambulatory Visit (HOSPITAL_COMMUNITY): Payer: Self-pay

## 2023-01-07 ENCOUNTER — Other Ambulatory Visit: Payer: Self-pay

## 2023-01-09 DIAGNOSIS — I10 Essential (primary) hypertension: Secondary | ICD-10-CM | POA: Diagnosis not present

## 2023-02-26 ENCOUNTER — Other Ambulatory Visit (HOSPITAL_COMMUNITY): Payer: Self-pay

## 2023-02-26 ENCOUNTER — Other Ambulatory Visit: Payer: Self-pay | Admitting: Cardiology

## 2023-02-26 DIAGNOSIS — I2510 Atherosclerotic heart disease of native coronary artery without angina pectoris: Secondary | ICD-10-CM

## 2023-02-26 DIAGNOSIS — E785 Hyperlipidemia, unspecified: Secondary | ICD-10-CM

## 2023-02-26 MED ORDER — REPATHA SURECLICK 140 MG/ML ~~LOC~~ SOAJ
1.0000 mL | SUBCUTANEOUS | 0 refills | Status: DC
  Filled 2023-02-26: qty 6, 84d supply, fill #0

## 2023-02-27 ENCOUNTER — Other Ambulatory Visit (HOSPITAL_COMMUNITY): Payer: Self-pay

## 2023-03-08 ENCOUNTER — Other Ambulatory Visit (HOSPITAL_COMMUNITY): Payer: Self-pay

## 2023-03-14 ENCOUNTER — Telehealth: Payer: Self-pay | Admitting: Pharmacist

## 2023-03-14 ENCOUNTER — Other Ambulatory Visit (HOSPITAL_COMMUNITY): Payer: Self-pay

## 2023-03-14 ENCOUNTER — Telehealth: Payer: Self-pay | Admitting: Pharmacy Technician

## 2023-03-14 NOTE — Telephone Encounter (Signed)
PA renewal requested for Repatha

## 2023-03-14 NOTE — Telephone Encounter (Signed)
Pharmacy Patient Advocate Encounter   Received notification from Pt Calls Messages that prior authorization for repatha is required/requested.   Insurance verification completed.   The patient is insured through [facility] .   Per test claim: PA required; PA submitted to above mentioned insurance via CoverMyMeds Key/confirmation #/EOC [id number] Status is pending

## 2023-03-18 ENCOUNTER — Other Ambulatory Visit (HOSPITAL_COMMUNITY): Payer: Self-pay

## 2023-03-18 NOTE — Telephone Encounter (Signed)
Pharmacy Patient Advocate Encounter  Received notification from [facility] that Prior Authorization for repatha has been APPROVED from 03/18/23 to 03/16/24   PA #/Case ID/Reference #: [id number]

## 2023-03-19 ENCOUNTER — Other Ambulatory Visit (HOSPITAL_COMMUNITY): Payer: Self-pay

## 2023-03-24 ENCOUNTER — Other Ambulatory Visit (HOSPITAL_COMMUNITY): Payer: Self-pay

## 2023-03-24 NOTE — Telephone Encounter (Signed)
Call to inform patient about PA approval. LVM

## 2023-04-05 ENCOUNTER — Other Ambulatory Visit (HOSPITAL_COMMUNITY): Payer: Self-pay

## 2023-04-08 ENCOUNTER — Other Ambulatory Visit (HOSPITAL_COMMUNITY): Payer: Self-pay

## 2023-05-30 ENCOUNTER — Encounter: Payer: Self-pay | Admitting: Cardiology

## 2023-05-30 ENCOUNTER — Other Ambulatory Visit: Payer: Self-pay

## 2023-05-30 DIAGNOSIS — I2510 Atherosclerotic heart disease of native coronary artery without angina pectoris: Secondary | ICD-10-CM

## 2023-05-30 DIAGNOSIS — E785 Hyperlipidemia, unspecified: Secondary | ICD-10-CM

## 2023-05-30 DIAGNOSIS — Z79899 Other long term (current) drug therapy: Secondary | ICD-10-CM

## 2023-05-30 NOTE — Progress Notes (Signed)
 Labs ordered for appt 08/2023.

## 2023-06-26 DIAGNOSIS — Z860100 Personal history of colon polyps, unspecified: Secondary | ICD-10-CM | POA: Diagnosis not present

## 2023-06-26 DIAGNOSIS — Z800 Family history of malignant neoplasm of digestive organs: Secondary | ICD-10-CM | POA: Diagnosis not present

## 2023-07-07 ENCOUNTER — Other Ambulatory Visit (HOSPITAL_COMMUNITY): Payer: 59

## 2023-07-08 ENCOUNTER — Other Ambulatory Visit (HOSPITAL_COMMUNITY): Payer: 59

## 2023-07-08 ENCOUNTER — Ambulatory Visit (INDEPENDENT_AMBULATORY_CARE_PROVIDER_SITE_OTHER): Payer: 59

## 2023-07-08 DIAGNOSIS — I359 Nonrheumatic aortic valve disorder, unspecified: Secondary | ICD-10-CM | POA: Diagnosis not present

## 2023-07-08 LAB — ECHOCARDIOGRAM COMPLETE
AR max vel: 1.34 cm2
AV Area VTI: 1.39 cm2
AV Area mean vel: 1.15 cm2
AV Mean grad: 15 mmHg
AV Peak grad: 26 mmHg
AV Vena cont: 0.57 cm
Ao pk vel: 2.55 m/s
Area-P 1/2: 3.48 cm2
P 1/2 time: 658 ms
S' Lateral: 2.71 cm

## 2023-07-09 ENCOUNTER — Encounter: Payer: Self-pay | Admitting: Cardiology

## 2023-07-09 ENCOUNTER — Telehealth: Payer: Self-pay

## 2023-07-09 DIAGNOSIS — Z01812 Encounter for preprocedural laboratory examination: Secondary | ICD-10-CM

## 2023-07-09 DIAGNOSIS — I350 Nonrheumatic aortic (valve) stenosis: Secondary | ICD-10-CM

## 2023-07-09 NOTE — Telephone Encounter (Signed)
 Spoke with pt's wife (per DPR) and relayed Dr. Norris Cross notes on pt's echo. Pt made aware that Dr. Mayford Knife would like him to have a repeat echo in one year. Pt verbalized understanding. An order was placed. Pt also agreeable to have cardiac MRI. An order was placed. Pt told the instructions would be sent via MyChart. Instructions sent. Pt was told to have a CBC drawn prior to MRI. Pt understands to go to any [facility] to have labs drawn. An order was placed for labs and released. Pt's wife verbalized understanding. All questions, if any, were answered.

## 2023-07-09 NOTE — Telephone Encounter (Signed)
-----   Message from Nurse Corky Crafts sent at 07/09/2023 12:15 PM EDT -----  ----- Message ----- From: Quintella Reichert, MD Sent: 07/09/2023  10:00 AM EDT To: [facility] Triage  2D echo showed low normal LV function EF 50 to 55% with thickening of the heart muscle.  The right ventricle was mildly enlarged with mildly reduced right ventricular function.  Mildly leaky mitral valve.  There is moderately leaky aortic valve and mild to moderate aortic valve stenosis.  Compared to echo a year ago right ventricular function has slightly decreased please repeat 2D echo in 1 year for aortic stenosis.  Given severe basal septal hypertrophy and now RV dysfunction I would like him to have a cardiac MRI with gadolinium

## 2023-08-22 ENCOUNTER — Other Ambulatory Visit (HOSPITAL_COMMUNITY): Payer: Self-pay

## 2023-08-22 ENCOUNTER — Other Ambulatory Visit: Payer: Self-pay | Admitting: Cardiology

## 2023-08-22 DIAGNOSIS — I2510 Atherosclerotic heart disease of native coronary artery without angina pectoris: Secondary | ICD-10-CM

## 2023-08-22 DIAGNOSIS — E785 Hyperlipidemia, unspecified: Secondary | ICD-10-CM

## 2023-08-22 MED ORDER — REPATHA SURECLICK 140 MG/ML ~~LOC~~ SOAJ
1.0000 mL | SUBCUTANEOUS | 1 refills | Status: DC
  Filled 2023-08-22: qty 6, 84d supply, fill #0
  Filled 2023-11-26: qty 6, 84d supply, fill #1

## 2023-09-03 ENCOUNTER — Other Ambulatory Visit: Payer: Self-pay | Admitting: *Deleted

## 2023-09-03 DIAGNOSIS — E785 Hyperlipidemia, unspecified: Secondary | ICD-10-CM

## 2023-09-03 DIAGNOSIS — Z79899 Other long term (current) drug therapy: Secondary | ICD-10-CM

## 2023-09-03 NOTE — Addendum Note (Signed)
 Addended by: Solace Wendorff C on: 09/03/2023 09:06 AM   Modules accepted: Orders

## 2023-09-08 DIAGNOSIS — E785 Hyperlipidemia, unspecified: Secondary | ICD-10-CM | POA: Diagnosis not present

## 2023-09-08 DIAGNOSIS — Z01812 Encounter for preprocedural laboratory examination: Secondary | ICD-10-CM | POA: Diagnosis not present

## 2023-09-08 DIAGNOSIS — I2510 Atherosclerotic heart disease of native coronary artery without angina pectoris: Secondary | ICD-10-CM | POA: Diagnosis not present

## 2023-09-08 LAB — COMPREHENSIVE METABOLIC PANEL WITH GFR
ALT: 17 IU/L (ref 0–44)
AST: 20 IU/L (ref 0–40)
Albumin: 4.6 g/dL (ref 3.8–4.8)
Alkaline Phosphatase: 74 IU/L (ref 44–121)
BUN/Creatinine Ratio: 30 — ABNORMAL HIGH (ref 10–24)
BUN: 25 mg/dL (ref 8–27)
Bilirubin Total: 0.6 mg/dL (ref 0.0–1.2)
CO2: 23 mmol/L (ref 20–29)
Calcium: 10.3 mg/dL — ABNORMAL HIGH (ref 8.6–10.2)
Chloride: 100 mmol/L (ref 96–106)
Creatinine, Ser: 0.83 mg/dL (ref 0.76–1.27)
Globulin, Total: 2.1 g/dL (ref 1.5–4.5)
Glucose: 83 mg/dL (ref 70–99)
Potassium: 4.6 mmol/L (ref 3.5–5.2)
Sodium: 138 mmol/L (ref 134–144)
Total Protein: 6.7 g/dL (ref 6.0–8.5)
eGFR: 92 mL/min/{1.73_m2} (ref >59)

## 2023-09-09 ENCOUNTER — Encounter: Payer: Self-pay | Admitting: Cardiology

## 2023-09-09 ENCOUNTER — Ambulatory Visit: Payer: Self-pay | Admitting: Cardiology

## 2023-09-09 DIAGNOSIS — I059 Rheumatic mitral valve disease, unspecified: Secondary | ICD-10-CM

## 2023-09-09 DIAGNOSIS — I519 Heart disease, unspecified: Secondary | ICD-10-CM

## 2023-09-09 DIAGNOSIS — I079 Rheumatic tricuspid valve disease, unspecified: Secondary | ICD-10-CM

## 2023-09-09 LAB — LIPID PANEL
Chol/HDL Ratio: 3.3 ratio (ref 0.0–5.0)
Cholesterol, Total: 159 mg/dL (ref 100–199)
HDL: 48 mg/dL (ref >39)
LDL Chol Calc (NIH): 90 mg/dL (ref 0–99)
Triglycerides: 119 mg/dL (ref 0–149)
VLDL Cholesterol Cal: 21 mg/dL (ref 5–40)

## 2023-09-09 LAB — CBC
Hematocrit: 47 % (ref 37.5–51.0)
Hemoglobin: 15.5 g/dL (ref 13.0–17.7)
MCH: 30.5 pg (ref 26.6–33.0)
MCHC: 33 g/dL (ref 31.5–35.7)
MCV: 92 fL (ref 79–97)
Platelets: 254 10*3/uL (ref 150–450)
RBC: 5.09 x10E6/uL (ref 4.14–5.80)
RDW: 12.6 % (ref 11.6–15.4)
WBC: 5.5 10*3/uL (ref 3.4–10.8)

## 2023-09-16 ENCOUNTER — Encounter (HOSPITAL_COMMUNITY): Payer: Self-pay

## 2023-09-16 ENCOUNTER — Ambulatory Visit: Payer: 59 | Attending: Cardiology | Admitting: Cardiology

## 2023-09-16 ENCOUNTER — Encounter: Payer: Self-pay | Admitting: Cardiology

## 2023-09-16 VITALS — BP 144/82 | HR 56 | Ht 68.5 in | Wt 145.0 lb

## 2023-09-16 DIAGNOSIS — I6523 Occlusion and stenosis of bilateral carotid arteries: Secondary | ICD-10-CM

## 2023-09-16 DIAGNOSIS — I350 Nonrheumatic aortic (valve) stenosis: Secondary | ICD-10-CM | POA: Diagnosis not present

## 2023-09-16 DIAGNOSIS — I2510 Atherosclerotic heart disease of native coronary artery without angina pectoris: Secondary | ICD-10-CM | POA: Diagnosis not present

## 2023-09-16 DIAGNOSIS — I10 Essential (primary) hypertension: Secondary | ICD-10-CM | POA: Diagnosis not present

## 2023-09-16 DIAGNOSIS — E785 Hyperlipidemia, unspecified: Secondary | ICD-10-CM

## 2023-09-16 DIAGNOSIS — I77810 Thoracic aortic ectasia: Secondary | ICD-10-CM

## 2023-09-16 NOTE — Addendum Note (Signed)
 Addended by: Cherylyn Cos on: 09/16/2023 08:42 AM   Modules accepted: Orders

## 2023-09-16 NOTE — Progress Notes (Signed)
 Cardiology Office Note:    Date:  09/16/2023   ID:  Travis Harrison, DOB Dec 05, 1950, MRN [id number]  PCP:  Jearldine Mina, MD  Cardiologist:  Gaylyn Keas, MD    Referring MD: Jearldine Mina, MD   Chief Complaint  Patient presents with   Coronary Artery Disease   Hypertension   Hyperlipidemia    History of Present Illness:    Travis Harrison is a 73 y.o. male with a hx of HTN, dyslipidemia, moderate carotid artery stenosis and ASCAD s/p CABG 2015 with a L-LAD and RIMA-OM1.  Pre-op carotid US  demonstrated RICA 40-59% and LICA 1-39%.   He is here today for followup and is doing well.  He denies any chest pain or pressure, SOB, DOE, PND, orthopnea, LE edema, dizziness, palpitations or syncope. He is compliant with his meds and is tolerating meds with no SE.      Past Medical History:  Diagnosis Date   Aortic valve disease    Mild to moderate aortic stenosis with moderate AR by echo 06/2023   Ascending aorta dilatation (HCC) 05/26/2018   70m by echo 06/2022  and 47m by CTA 06/2022.  37mm by echo 06/2023   Carotid artery stenosis, asymptomatic    1-39% bilateral stenosis by dopplers 06/2021   Hyperlipidemia    Hypertension    S/P CABG x 2 03/04/2014   LIMA to LAD and RIMA to OM1    Past Surgical History:  Procedure Laterality Date   CARDIAC CATHETERIZATION     COLONOSCOPY  2009   AT [place]=normal exam per pt   CORONARY ARTERY BYPASS GRAFT N/A 03/04/2014   Procedure: CORONARY ARTERY BYPASS GRAFTING (CABG);  Surgeon: Gardenia Jump, MD;  Location: [facility] OR;  Service: Open Heart Surgery;  Laterality: N/A;   INTRAOPERATIVE TRANSESOPHAGEAL ECHOCARDIOGRAM N/A 03/04/2014   Procedure: INTRAOPERATIVE TRANSESOPHAGEAL ECHOCARDIOGRAM;  Surgeon: Gardenia Jump, MD;  Location: [facility] OR;  Service: Open Heart Surgery;  Laterality: N/A;   KNEE ARTHROSCOPY Bilateral 2018   bil.   LEFT HEART CATHETERIZATION WITH CORONARY ANGIOGRAM N/A 03/03/2014   Procedure: LEFT HEART CATHETERIZATION WITH CORONARY  ANGIOGRAM;  Surgeon: Arlander Bellman, MD;  Location: [facility] CATH LAB;  Service: Cardiovascular;  Laterality: N/A;    Current Medications: No outpatient medications have been marked as taking for the 09/16/23 encounter (Office Visit) with Jacqueline Matsu, MD.     Allergies:   Patient has no known allergies.   Social History   Socioeconomic History   Marital status: Married    Spouse name: Not on file   Number of children: Not on file   Years of education: Not on file   Highest education level: Not on file  Occupational History   Not on file  Tobacco Use   Smoking status: Former    Current packs/day: 0.00    Types: Cigarettes    Quit date: 03/04/1999    Years since quitting: 24.5   Smokeless tobacco: Never  Vaping Use   Vaping status: Never Used  Substance and Sexual Activity   Alcohol use: Yes    Alcohol/week: 6.0 standard drinks of alcohol    Types: 6 Cans of beer per week   Drug use: Not Currently   Sexual activity: Not on file  Other Topics Concern   Not on file  Social History Narrative   Married and lives with his wife.  Works for [employer].  Physically active, walks 2 miles daily.   Social Drivers of Health  Financial Resource Strain: Not on file  Food Insecurity: Not on file  Transportation Needs: Not on file  Physical Activity: Not on file  Stress: Not on file  Social Connections: Not on file     Family History: The patient's family history includes CVA in his father; Heart attack in his mother; Heart attack (age of onset: 27) in his father; Heart disease in his father and mother; Hypertension in his father; Stomach cancer in his mother. There is no history of Colon cancer, Colon polyps, Esophageal cancer, or Rectal cancer.  ROS:   Please see the history of present illness.    ROS  All other systems reviewed and negative.   EKGs/Labs/Other Studies Reviewed:    The following studies were reviewed today: Outside labs from PCP from  [facility]  EKG Interpretation Date/Time:  Tuesday Sep 16 2023 07:58:30 EDT Ventricular Rate:  56 PR Interval:  234 QRS Duration:  94 QT Interval:  420 QTC Calculation: 405 R Axis:   56  Text Interpretation: Sinus bradycardia with 1st degree A-V block Minimal voltage criteria for LVH, may be normal variant ( Sokolow-Lyon ) When compared with ECG of 05-Mar-2014 07:33, PR interval has increased T wave inversion no longer evident in Anterolateral leads Confirmed by Gaylyn Keas ([id number]) on 09/16/2023 8:31:26 AM    Recent Labs: 09/08/2023: ALT 17; BUN 25; Creatinine, Ser 0.83; Hemoglobin 15.5; Platelets 254; Potassium 4.6; Sodium 138   Recent Lipid Panel    Component Value Date/Time   CHOL 159 09/08/2023 0818   TRIG 119 09/08/2023 0818   HDL 48 09/08/2023 0818   CHOLHDL 3.3 09/08/2023 0818   CHOLHDL 2.9 03/21/2016 0730   VLDL 15 03/21/2016 0730   LDLCALC 90 09/08/2023 0818   LDLDIRECT 87.0 05/18/2014 0912    Physical Exam:    VS:  BP (!) 144/82   Pulse (!) 56   Ht 5' 8.5" (1.74 m)   Wt 145 lb (65.8 kg)   SpO2 98%   BMI 21.73 kg/m     Wt Readings from Last 3 Encounters:  09/16/23 145 lb (65.8 kg)  09/10/22 157 lb (71.2 kg)  05/24/21 153 lb 9.6 oz (69.7 kg)    GEN: Well nourished, well developed in no acute distress HEENT: Normal NECK: No JVD; No carotid bruits LYMPHATICS: No lymphadenopathy CARDIAC:RRR, no  rubs, gallops 2/6 SM at RUSB RESPIRATORY:  Clear to auscultation without rales, wheezing or rhonchi  ABDOMEN: Soft, non-tender, non-distended MUSCULOSKELETAL:  No edema; No deformity  SKIN: Warm and dry NEUROLOGIC:  Alert and oriented x 3 PSYCHIATRIC:  Normal affect   ASSESSMENT:    1. Coronary artery disease involving native coronary artery of native heart without angina pectoris   2. Essential hypertension   3. Asymptomatic bilateral carotid artery stenosis   4. Hyperlipidemia with target LDL less than 70   5. Dilated aortic root (HCC)   6. Aortic valve stenosis,  etiology of cardiac valve disease unspecified    PLAN:    In order of problems listed above:  1.  ASCAD - s/p CABG 2015 with a L-LAD and RIMA-OM1.   - He has not had any anginal chest pain since I saw him last -continue aspirin  81 mg daily, Repatha , Toprol -XL 50 mg daily with as needed refills -he is statin intolerant  2.  HTN  -BP borderline controlled on exam today>>he has white coat HTN -Continue lisinopril  40 mg daily, Toprol -XL 50 mg daily, HCTZ 12.5 mg daily with as needed refills -I have  personally reviewed and interpreted outside labs performed by patient's PCP which showed serum creatinine 0.83 and potassium 4.6 on 09/08/2023   3.  Carotid artery stenosis -dopplers 1-39% bilateral by dopplers 07/09/2021 -continue Repatha  and ASA - Will repeat carotid Doppler since it has been several years   4.  Hyperlipidemia  - LDL goal is < 70.  -he is statin intolerant  -I have personally reviewed and interpreted outside labs performed by patient's PCP which showed LDL 90 and HDL 48 on 09/08/2023 -Continue Repatha  with as needed refills -I will get him back into lipid clinic due to LDL not being at goal>>he has not missed any doses in the past 6 months   5.  Dilated aortic root -his aortic root dimension was 41mm by 2D echo 04/2019 and 43 mm by chest CT 07/10/2022 -continue statin  -BP needs better control -getting MRI/MRA of heart tomorrow  6.  Aortic stenosis/AI -mild AS and mild to moderate AI by echo 04/2019 - Echo 07/10/2023 EF 50 to 55% with severe ASSH, mild RV enlargement and mild RV dysfunction, mild MR, mild to moderate AAS with mean aortic valve gradient 15 mmHg>> compared to echo 07/10/2022 mild RV systolic dysfunction is present and EF slightly reduced at 50 to 55% (was 55 to 60%) - he is having a cardiac MRI/MRI tomorrow for dilated Ao and RV dysfunction   Medication Adjustments/Labs and Tests Ordered: Current medicines are reviewed at length with the patient today.   Concerns regarding medicines are outlined above.  Orders Placed This Encounter  Procedures   EKG 12-Lead   No orders of the defined types were placed in this encounter.   Signed, Gaylyn Keas, MD  09/16/2023 8:29 AM    [facility]

## 2023-09-16 NOTE — Patient Instructions (Signed)
 Medication Instructions:  Your physician recommends that you continue on your current medications as directed. Please refer to the Current Medication list given to you today.  *If you need a refill on your cardiac medications before your next appointment, please call your pharmacy*  Lab Work: None.   If you have labs (blood work) drawn today and your tests are completely normal, you will receive your results only by: MyChart Message (if you have MyChart) OR A paper copy in the mail If you have any lab test that is abnormal or we need to change your treatment, we will call you to review the results.  Testing/Procedures: Your physician has requested that you have a carotid duplex. This test is an ultrasound of the carotid arteries in your neck. It looks at blood flow through these arteries that supply the brain with blood. Allow one hour for this exam. There are no restrictions or special instructions.   Follow-Up: At [facility], you and your health needs are our priority.  As part of our continuing mission to provide you with exceptional heart care, our providers are all part of one team.  This team includes your primary Cardiologist (physician) and Advanced Practice Providers or APPs (Physician Assistants and Nurse Practitioners) who all work together to provide you with the care you need, when you need it.  Your next appointment:   1 year(s)  Provider:   Gaylyn Keas, MD    We recommend signing up for the patient portal called "MyChart".  Sign up information is provided on this After Visit Summary.  MyChart is used to connect with patients for Virtual Visits (Telemedicine).  Patients are able to view lab/test results, encounter notes, upcoming appointments, etc.  Non-urgent messages can be sent to your provider as well.   To learn more about what you can do with MyChart, go to [email or web address].   Other Instructions Dr. Micael Adas has referred you to our lipid clinic for  further management of your cholesterol medications. This is a clinic run by our pharmacists. Someone will call you to get you scheduled.

## 2023-09-17 ENCOUNTER — Encounter: Payer: Self-pay | Admitting: Cardiology

## 2023-09-17 ENCOUNTER — Other Ambulatory Visit: Payer: Self-pay | Admitting: Cardiology

## 2023-09-17 ENCOUNTER — Ambulatory Visit (HOSPITAL_COMMUNITY)
Admission: RE | Admit: 2023-09-17 | Discharge: 2023-09-17 | Disposition: A | Discharge status: Home / Self Care | Source: Ambulatory Visit | Attending: Cardiology | Admitting: Cardiology

## 2023-09-17 DIAGNOSIS — I519 Heart disease, unspecified: Secondary | ICD-10-CM | POA: Insufficient documentation

## 2023-09-17 DIAGNOSIS — I350 Nonrheumatic aortic (valve) stenosis: Secondary | ICD-10-CM | POA: Diagnosis not present

## 2023-09-17 DIAGNOSIS — I079 Rheumatic tricuspid valve disease, unspecified: Secondary | ICD-10-CM | POA: Insufficient documentation

## 2023-09-17 DIAGNOSIS — I059 Rheumatic mitral valve disease, unspecified: Secondary | ICD-10-CM | POA: Insufficient documentation

## 2023-09-17 MED ORDER — GADOBUTROL 1 MMOL/ML IV SOLN
10.0000 mL | Freq: Once | INTRAVENOUS | Status: AC | PRN
  Administered 2023-09-17: 10 mL via INTRAVENOUS

## 2023-09-19 NOTE — Telephone Encounter (Signed)
 Call to patient to discuss cardiac MRI results. Spoke to spouse Larrie (DPR) to advise cardiac MRI showed normal LV size and systolic function EF 59% with mild increase in the basal interventricular septum at 12 mm.  Normal RV size with mild decreased RV function and mildly dilated ascending aorta at 43 mm present.  Thickened aortic valve with mild to moderate aortic stenosis with mild to moderate leakiness of the aortic valve,  and trivial pulmonic regurgitation.  There is prolapse of the anterior posterior tricuspid valve leaflets with mild TR. Borderline mitral valve prolapse of the anterior leaflet with no MR also present. Explained that etiology of mildly reduced RV function difficult to asses and Dr. Micael Adas would like to order PFTs with DLCO, home sleep study to rule out sleep apnea and VQ scan to rule out chronic pulmonary embolic disease. Larrie verbalizes understanding and states she will discuss with patient. [facility] message also sent.

## 2023-09-25 ENCOUNTER — Other Ambulatory Visit: Payer: Self-pay

## 2023-09-25 DIAGNOSIS — I519 Heart disease, unspecified: Secondary | ICD-10-CM

## 2023-09-25 NOTE — Progress Notes (Signed)
 PFT ordered

## 2023-09-27 ENCOUNTER — Other Ambulatory Visit (HOSPITAL_COMMUNITY): Payer: Self-pay

## 2023-09-27 MED ORDER — BISACODYL 5 MG PO TBEC
DELAYED_RELEASE_TABLET | ORAL | 0 refills | Status: AC
  Filled 2023-09-27: qty 4, 1d supply, fill #0

## 2023-09-27 MED ORDER — PEG 3350-KCL-NA BICARB-NACL 420 G PO SOLR
ORAL | 0 refills | Status: AC
  Filled 2023-09-27: qty 4000, 1d supply, fill #0

## 2023-10-02 ENCOUNTER — Encounter (HOSPITAL_COMMUNITY)

## 2023-10-07 DIAGNOSIS — D122 Benign neoplasm of ascending colon: Secondary | ICD-10-CM | POA: Diagnosis not present

## 2023-10-07 DIAGNOSIS — K5730 Diverticulosis of large intestine without perforation or abscess without bleeding: Secondary | ICD-10-CM | POA: Diagnosis not present

## 2023-10-07 DIAGNOSIS — D123 Benign neoplasm of transverse colon: Secondary | ICD-10-CM | POA: Diagnosis not present

## 2023-10-07 DIAGNOSIS — Z09 Encounter for follow-up examination after completed treatment for conditions other than malignant neoplasm: Secondary | ICD-10-CM | POA: Diagnosis not present

## 2023-10-07 DIAGNOSIS — Z860101 Personal history of adenomatous and serrated colon polyps: Secondary | ICD-10-CM | POA: Diagnosis not present

## 2023-10-07 DIAGNOSIS — K648 Other hemorrhoids: Secondary | ICD-10-CM | POA: Diagnosis not present

## 2023-10-23 ENCOUNTER — Ambulatory Visit (HOSPITAL_BASED_OUTPATIENT_CLINIC_OR_DEPARTMENT_OTHER)

## 2023-10-23 DIAGNOSIS — I6523 Occlusion and stenosis of bilateral carotid arteries: Secondary | ICD-10-CM | POA: Diagnosis not present

## 2023-10-29 ENCOUNTER — Encounter: Payer: Self-pay | Admitting: Cardiology

## 2023-11-04 ENCOUNTER — Ambulatory Visit: Payer: Self-pay

## 2023-11-04 DIAGNOSIS — E7800 Pure hypercholesterolemia, unspecified: Secondary | ICD-10-CM | POA: Diagnosis not present

## 2023-11-04 DIAGNOSIS — Z951 Presence of aortocoronary bypass graft: Secondary | ICD-10-CM | POA: Diagnosis not present

## 2023-11-04 DIAGNOSIS — I77810 Thoracic aortic ectasia: Secondary | ICD-10-CM | POA: Diagnosis not present

## 2023-11-04 DIAGNOSIS — I350 Nonrheumatic aortic (valve) stenosis: Secondary | ICD-10-CM | POA: Diagnosis not present

## 2023-11-04 DIAGNOSIS — Z0000 Encounter for general adult medical examination without abnormal findings: Secondary | ICD-10-CM | POA: Diagnosis not present

## 2023-11-04 DIAGNOSIS — G720 Drug-induced myopathy: Secondary | ICD-10-CM | POA: Diagnosis not present

## 2023-11-04 DIAGNOSIS — N402 Nodular prostate without lower urinary tract symptoms: Secondary | ICD-10-CM | POA: Diagnosis not present

## 2023-11-04 DIAGNOSIS — I779 Disorder of arteries and arterioles, unspecified: Secondary | ICD-10-CM | POA: Diagnosis not present

## 2023-11-04 DIAGNOSIS — F322 Major depressive disorder, single episode, severe without psychotic features: Secondary | ICD-10-CM | POA: Diagnosis not present

## 2023-11-04 DIAGNOSIS — I10 Essential (primary) hypertension: Secondary | ICD-10-CM | POA: Diagnosis not present

## 2023-11-06 ENCOUNTER — Ambulatory Visit: Admitting: Pharmacist

## 2023-12-04 ENCOUNTER — Telehealth: Payer: Self-pay | Admitting: *Deleted

## 2023-12-04 NOTE — Telephone Encounter (Signed)
 I called Mr. Masten to schedule his Pulmonary Function Test, her refused order closed.

## 2024-01-04 ENCOUNTER — Other Ambulatory Visit (HOSPITAL_COMMUNITY): Payer: Self-pay

## 2024-01-05 ENCOUNTER — Other Ambulatory Visit (HOSPITAL_COMMUNITY): Payer: Self-pay

## 2024-01-05 MED ORDER — BUPROPION HCL ER (SR) 150 MG PO TB12
150.0000 mg | ORAL_TABLET | Freq: Every morning | ORAL | 5 refills | Status: AC
  Filled 2024-01-05: qty 90, 90d supply, fill #0
  Filled 2024-04-02: qty 90, 90d supply, fill #1

## 2024-01-05 MED ORDER — HYDROCHLOROTHIAZIDE 12.5 MG PO TABS
12.5000 mg | ORAL_TABLET | Freq: Every morning | ORAL | 3 refills | Status: AC
  Filled 2024-01-05: qty 90, 90d supply, fill #0
  Filled 2024-04-02: qty 90, 90d supply, fill #1

## 2024-01-05 MED ORDER — METOPROLOL SUCCINATE ER 50 MG PO TB24
50.0000 mg | ORAL_TABLET | Freq: Every day | ORAL | 5 refills | Status: AC
  Filled 2024-01-05: qty 90, 90d supply, fill #0
  Filled 2024-04-02: qty 90, 90d supply, fill #1

## 2024-01-05 MED ORDER — LISINOPRIL 40 MG PO TABS
40.0000 mg | ORAL_TABLET | Freq: Every morning | ORAL | 3 refills | Status: AC
  Filled 2024-01-05: qty 90, 90d supply, fill #0
  Filled 2024-04-02: qty 90, 90d supply, fill #1

## 2024-02-29 DIAGNOSIS — W5501XA Bitten by cat, initial encounter: Secondary | ICD-10-CM | POA: Diagnosis not present

## 2024-02-29 DIAGNOSIS — I10 Essential (primary) hypertension: Secondary | ICD-10-CM | POA: Diagnosis not present

## 2024-02-29 DIAGNOSIS — L03113 Cellulitis of right upper limb: Secondary | ICD-10-CM | POA: Diagnosis not present

## 2024-04-01 ENCOUNTER — Telehealth: Payer: Self-pay | Admitting: Cardiology

## 2024-04-01 NOTE — Telephone Encounter (Signed)
 Patient wants a provider switch from Dr. Shlomo to Dr. Delford per Damien Bucco.

## 2024-04-02 ENCOUNTER — Other Ambulatory Visit: Payer: Self-pay

## 2024-04-02 ENCOUNTER — Other Ambulatory Visit: Payer: Self-pay | Admitting: Cardiology

## 2024-04-02 ENCOUNTER — Other Ambulatory Visit (HOSPITAL_COMMUNITY): Payer: Self-pay

## 2024-04-02 DIAGNOSIS — E785 Hyperlipidemia, unspecified: Secondary | ICD-10-CM

## 2024-04-02 DIAGNOSIS — I2510 Atherosclerotic heart disease of native coronary artery without angina pectoris: Secondary | ICD-10-CM

## 2024-04-02 MED ORDER — REPATHA SURECLICK 140 MG/ML ~~LOC~~ SOAJ
1.0000 mL | SUBCUTANEOUS | 1 refills | Status: AC
  Filled 2024-04-02 – 2024-04-05 (×2): qty 6, 84d supply, fill #0

## 2024-04-05 ENCOUNTER — Telehealth (HOSPITAL_COMMUNITY): Payer: Self-pay

## 2024-04-05 ENCOUNTER — Other Ambulatory Visit (HOSPITAL_COMMUNITY): Payer: Self-pay

## 2024-04-05 ENCOUNTER — Telehealth: Payer: Self-pay | Admitting: Pharmacy Technician

## 2024-04-05 NOTE — Telephone Encounter (Signed)
 Pharmacy Patient Advocate Encounter  Received notification from [facility] that Prior Authorization for repatha  has been APPROVED from 04/05/24 to 04/04/25   PA #/Case ID/Reference #: [id number]

## 2024-04-05 NOTE — Telephone Encounter (Signed)
° °  Pharmacy Patient Advocate Encounter   Received notification from Pt Calls Messages that prior authorization for repatha  is required/requested.   Insurance verification completed.   The patient is insured through [facility].   Per test claim: PA required; PA submitted to above mentioned insurance via Latent Key/confirmation #/EOC [id number] Status is pending

## 2024-04-05 NOTE — Telephone Encounter (Signed)
 PA request has been Submitted. New Encounter has been or will be created for follow up. For additional info see Pharmacy Prior Auth telephone encounter from 04/05/24.

## 2024-04-06 ENCOUNTER — Other Ambulatory Visit (HOSPITAL_COMMUNITY): Payer: Self-pay

## 2024-07-06 ENCOUNTER — Other Ambulatory Visit (HOSPITAL_BASED_OUTPATIENT_CLINIC_OR_DEPARTMENT_OTHER)

## 2024-08-12 ENCOUNTER — Ambulatory Visit: Admitting: Cardiovascular Disease
